# Patient Record
Sex: Female | Born: 1965 | Race: White | Hispanic: No | Smoking: Former smoker
Health system: Southern US, Community
[De-identification: ages and names within clinical notes are randomized; demographics above are authoritative.]

## PROBLEM LIST (undated history)

## (undated) DIAGNOSIS — F419 Anxiety disorder, unspecified: Secondary | ICD-10-CM

## (undated) DIAGNOSIS — N8501 Benign endometrial hyperplasia: Secondary | ICD-10-CM

## (undated) DIAGNOSIS — Z8719 Personal history of other diseases of the digestive system: Secondary | ICD-10-CM

## (undated) DIAGNOSIS — I10 Essential (primary) hypertension: Secondary | ICD-10-CM

## (undated) DIAGNOSIS — G5603 Carpal tunnel syndrome, bilateral upper limbs: Secondary | ICD-10-CM

## (undated) DIAGNOSIS — E785 Hyperlipidemia, unspecified: Secondary | ICD-10-CM

## (undated) DIAGNOSIS — F32A Depression, unspecified: Secondary | ICD-10-CM

## (undated) DIAGNOSIS — E669 Obesity, unspecified: Secondary | ICD-10-CM

## (undated) DIAGNOSIS — F329 Major depressive disorder, single episode, unspecified: Secondary | ICD-10-CM

## (undated) DIAGNOSIS — I2699 Other pulmonary embolism without acute cor pulmonale: Secondary | ICD-10-CM

## (undated) DIAGNOSIS — K219 Gastro-esophageal reflux disease without esophagitis: Secondary | ICD-10-CM

## (undated) DIAGNOSIS — J189 Pneumonia, unspecified organism: Secondary | ICD-10-CM

## (undated) DIAGNOSIS — N950 Postmenopausal bleeding: Secondary | ICD-10-CM

## (undated) HISTORY — DX: Essential (primary) hypertension: I10

## (undated) HISTORY — PX: WISDOM TOOTH EXTRACTION: SHX21

## (undated) HISTORY — PX: UPPER GI ENDOSCOPY: SHX6162

## (undated) HISTORY — PX: COLONOSCOPY: SHX174

---
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
## 2018-01-22 ENCOUNTER — Other Ambulatory Visit (HOSPITAL_COMMUNITY): Payer: Self-pay | Admitting: General Surgery

## 2018-01-23 ENCOUNTER — Other Ambulatory Visit: Payer: Self-pay | Admitting: General Surgery

## 2018-01-23 DIAGNOSIS — Z1231 Encounter for screening mammogram for malignant neoplasm of breast: Secondary | ICD-10-CM

## 2018-01-29 ENCOUNTER — Encounter (HOSPITAL_COMMUNITY): Payer: Self-pay | Admitting: Radiology

## 2018-01-29 ENCOUNTER — Ambulatory Visit (HOSPITAL_COMMUNITY)
Admission: RE | Admit: 2018-01-29 | Discharge: 2018-01-29 | Disposition: A | Discharge status: Home / Self Care | Payer: BC Managed Care – PPO | Source: Ambulatory Visit | Attending: General Surgery | Admitting: General Surgery

## 2018-01-29 ENCOUNTER — Other Ambulatory Visit (HOSPITAL_COMMUNITY): Payer: Self-pay | Admitting: General Surgery

## 2018-01-29 DIAGNOSIS — R918 Other nonspecific abnormal finding of lung field: Secondary | ICD-10-CM | POA: Insufficient documentation

## 2018-01-29 DIAGNOSIS — K449 Diaphragmatic hernia without obstruction or gangrene: Secondary | ICD-10-CM | POA: Insufficient documentation

## 2018-02-18 ENCOUNTER — Encounter: Payer: Self-pay | Admitting: Registered"

## 2018-02-18 ENCOUNTER — Encounter: Payer: BC Managed Care – PPO | Attending: General Surgery | Admitting: Registered"

## 2018-02-18 DIAGNOSIS — I10 Essential (primary) hypertension: Secondary | ICD-10-CM | POA: Diagnosis not present

## 2018-02-18 DIAGNOSIS — E669 Obesity, unspecified: Secondary | ICD-10-CM

## 2018-02-18 DIAGNOSIS — Z713 Dietary counseling and surveillance: Secondary | ICD-10-CM | POA: Insufficient documentation

## 2018-02-18 DIAGNOSIS — E785 Hyperlipidemia, unspecified: Secondary | ICD-10-CM | POA: Insufficient documentation

## 2018-02-18 DIAGNOSIS — Z6841 Body Mass Index (BMI) 40.0 and over, adult: Secondary | ICD-10-CM | POA: Diagnosis not present

## 2018-02-18 NOTE — Patient Instructions (Signed)
-   Replace pop tarts with protein shake for breakfast.

## 2018-02-18 NOTE — Progress Notes (Signed)
Pre-Op Assessment Visit:  Pre-Operative RYGB Surgery  Medical Nutrition Therapy:  Appt start time: 3:15  End time:  4:30  Patient was seen on 02/18/2018 for Pre-Operative Nutrition Assessment. Assessment and letter of approval [facility] to [facility] coordinator on 02/18/2018.   Pt expectation of surgery: improve health, feel better, improve hip pain, increase ability to walk for longer distances, improve quality of life   Pt expectation of Dietitian: educate and equip  Start weight at [facility]: 222.6 BMI: 42.76   Pt arrives with boyfriend. Pt states her sister had bariatric surgery with our program. Pt states she has done a lot of research and participates in various online bariatric support groups. Pt states she feels like this is her last option to prevent heart conditions that her mom experienced. Pt states life is stressful right now with work; works as Sports coachelementary school teacher assistant, bus driver, and at daycare in the evenings.   Per insurance, pt needs 2 SWL visits prior to surgery. Pt will need Vitamins and Mineral Recommendations education and handout at next visit.    24 hr Dietary Recall: First Meal: skips on weekends; 2 pop tarts (chocolate fudge) Snack: none Second Meal: skips on weekends; sandwich (ham, cheese, lettuce) + creamy tomato soup + wheat crackers Snack: none Third Meal: sausage + scrambled eggs + hashbrowns or soft tacos (ground beef + vegetables) or spaghetti/hamburger helper + garlic bread  Snack: none Beverages: diet cherry Dr. Reino KentPepper, orange juice, water, wine  Encouraged to engage in 75 minutes of moderate physical activity including cardiovascular and weight baring weekly  Handouts given during visit include:  . Pre-Op Goals . Bariatric Surgery Protein Shakes . Vitamin and Mineral Recommendations  During the appointment today the following Pre-Op Goals were reviewed with the patient: . Track your food and beverage:  MyFitness Pal or Baritastic App . Make healthy food choices . Begin to limit portion sizes . Limited concentrated sugars and fried foods . Keep fat/sugar in the single digits per serving on          food labels . Practice CHEWING your food  (aim for 30 chews per bite or until applesauce consistency) . Practice not drinking 15 minutes before, during, and 30 minutes after each meal/snack . Avoid all carbonated beverages  . Avoid/limit caffeinated beverages  . Avoid all sugar-sweetened beverages . Avoid alcohol . Consume 3 meals per day; eat every 3-5 hours . Make a list of non-food related activities . Aim for 64-100 ounces of FLUID daily  . Aim for at least 60-80 grams of PROTEIN daily . Look for a liquid protein source that contain ?15 g protein and ?5 g carbohydrate  (ex: shakes, drinks, shots) . Physical activity is an important part of a healthy lifestyle so keep it moving!  Follow diet recommendations listed below Energy and Macronutrient Recommendations: Calories: 1600 Carbohydrate: 180 Protein: 120 Fat: 44  Demonstrated degree of understanding via:  Teach Back   Teaching Method Utilized:  Visual Auditory Hands on  Barriers to learning/adherence to lifestyle change: work-life balance  Patient to call the [facility] to enroll in Pre-Op and Post-Op Nutrition Education when surgery date is scheduled.

## 2018-03-20 ENCOUNTER — Encounter: Payer: BC Managed Care – PPO | Attending: General Surgery | Admitting: Skilled Nursing Facility1

## 2018-03-20 ENCOUNTER — Encounter: Payer: Self-pay | Admitting: Skilled Nursing Facility1

## 2018-03-20 DIAGNOSIS — Z6841 Body Mass Index (BMI) 40.0 and over, adult: Secondary | ICD-10-CM | POA: Insufficient documentation

## 2018-03-20 DIAGNOSIS — I10 Essential (primary) hypertension: Secondary | ICD-10-CM | POA: Insufficient documentation

## 2018-03-20 DIAGNOSIS — Z713 Dietary counseling and surveillance: Secondary | ICD-10-CM | POA: Diagnosis present

## 2018-03-20 DIAGNOSIS — E669 Obesity, unspecified: Secondary | ICD-10-CM

## 2018-03-20 DIAGNOSIS — E785 Hyperlipidemia, unspecified: Secondary | ICD-10-CM | POA: Insufficient documentation

## 2018-03-20 NOTE — Progress Notes (Signed)
RYGB Assessment:   1 SWL Appointment.   Per insurance, pt needs 2 SWL visits prior to surgery.    Pt states she has been drinking 60 ounces of water a day. Pt states she cannot afford the protein shakes so she tries to get the protein powder. Pt states she is financially strained which is a concerned. Dietitian educated the pt on needing the funds available to be provided for after surgery. Pt states her and her daughter live together.   Start weight at [facility]: 222.6 Weight: 223 BMI: 42.83  MEDICATIONS: see list   DIETARY INTAKE:  24-hr recall: skipping breakfast and lunch on the weekend, not drinking water on the weekend B ( AM):  poptart with diet dr pepper Snk ( AM):  L ( PM): sandwich and creamy tomato soup and crackers  Snk ( PM):  D ( PM): egg sausage and hashbrown  Snk ( PM):  Beverages: water, diet soda  Usual physical activity: ADL's  Calories: 1600 Carbohydrate: 180 Protein: 120 Fat: 44    Nutritional Diagnosis:  [place]-3.3 Overweight/obesity related to past poor dietary habits and physical inactivity as evidenced by patient w/ planned RYGB surgery following dietary guidelines for continued weight loss.    Intervention:  Nutrition counseling for upcoming Bariatric Surgery. Goals: -Encouraged to engage in 150 minutes of moderate physical activity including cardiovascular and weight baring weekly -Chew until applesauce consistency  -Work on consistently eating 3-5 hours during the week and weekend in addition to getting in water -Work on eating non starchy veggies 2 times a day 7 days a week   Teaching Method Utilized:  Visual Auditory Hands on  Barriers to learning/adherence to lifestyle change: none identified   Demonstrated degree of understanding via:  Teach Back   Monitoring/Evaluation:  Dietary intake, exercise, and body weight prn.

## 2018-05-01 ENCOUNTER — Encounter: Payer: Self-pay | Admitting: Skilled Nursing Facility1

## 2018-05-01 ENCOUNTER — Encounter: Payer: BC Managed Care – PPO | Attending: General Surgery | Admitting: Skilled Nursing Facility1

## 2018-05-01 DIAGNOSIS — E785 Hyperlipidemia, unspecified: Secondary | ICD-10-CM | POA: Diagnosis not present

## 2018-05-01 DIAGNOSIS — E669 Obesity, unspecified: Secondary | ICD-10-CM

## 2018-05-01 DIAGNOSIS — I10 Essential (primary) hypertension: Secondary | ICD-10-CM | POA: Insufficient documentation

## 2018-05-01 DIAGNOSIS — Z713 Dietary counseling and surveillance: Secondary | ICD-10-CM | POA: Insufficient documentation

## 2018-05-01 DIAGNOSIS — Z6841 Body Mass Index (BMI) 40.0 and over, adult: Secondary | ICD-10-CM | POA: Insufficient documentation

## 2018-05-01 NOTE — Progress Notes (Signed)
RYGB Assessment:  2nd SWL Appointment.   Per insurance, pt needs 2 SWL visits prior to surgery.    Pt states she has been drinking 60 ounces of water a day.  Pt arrives having gained about 2 pounds. Pt states she works at an [employer]. Pt complained of not being able to get in contact wth [facility] in a timely manner.  Pt states she feels she has been most successful with eating non starchy vegetables, chewing until applesauce consistency and drinking more water Pt states she thinks she may struggle with meeting her needs because her stomach is going to be smaller and not drinking diet dr pepper  Start weight at [facility]: 222.6 Weight: 225 BMI: 43.26  MEDICATIONS: see list   DIETARY INTAKE:  24-hr recall: skipping breakfast and lunch on the weekend, not drinking water on the weekend B ( AM):  poptart with diet dr pepper Snk ( AM):  L ( PM): sandwich and creamy tomato soup and crackers and carrots or celery Snk ( PM):  D ( PM): egg sausage and hashbrown or broccoli with cheese and hamburger steak or marinated chicken with salad  Snk ( PM): cakes before bed  Beverages: water, diet soda  Usual physical activity: ADL's  Calories: 1600 Carbohydrate: 180 Protein: 120 Fat: 44    Nutritional Diagnosis:  [place]-3.3 Overweight/obesity related to past poor dietary habits and physical inactivity as evidenced by patient w/ planned RYGB surgery following dietary guidelines for continued weight loss.    Intervention:  Nutrition counseling for upcoming Bariatric Surgery. Goals: -Encouraged to engage in 150 minutes of moderate physical activity including cardiovascular and weight baring weekly -Chew until applesauce consistency  -Work on consistently eating 3-5 hours during the week and weekend in addition to getting in water -Work on eating non starchy veggies 2 times a day 7 days a week   Teaching Method Utilized:  Visual Auditory Hands on  Barriers to learning/adherence to lifestyle  change: none identified   Demonstrated degree of understanding via:  Teach Back   Monitoring/Evaluation:  Dietary intake, exercise, and body weight prn.

## 2018-06-03 ENCOUNTER — Ambulatory Visit: Payer: BC Managed Care – PPO

## 2018-09-27 HISTORY — PX: DILATION AND CURETTAGE OF UTERUS: SHX78

## 2018-10-07 ENCOUNTER — Ambulatory Visit: Payer: BC Managed Care – PPO

## 2018-11-07 HISTORY — PX: TOTAL LAPAROSCOPIC HYSTERECTOMY WITH BILATERAL SALPINGO OOPHORECTOMY: SHX6845

## 2018-12-27 ENCOUNTER — Ambulatory Visit: Payer: Self-pay | Admitting: Surgery

## 2018-12-27 NOTE — H&P (Signed)
Surgical H&P  CC: morbid obesity  HPI: Follows up for management of severe obesity. Labs completed last year noted only mildly low B12 and vit D which were addressed by PCP. Last visit with dieticians was 05/01/18. Did not have psych eval until 10/13/18 [facility] [place], [place]).  Chest x-ray completed September 2017 with bilateral interstitial prominence which may reflect smoking history, chronic bronchitis or reactive airway disease, no pneumonia or pulmonary edema.  Her upper GI did demonstrate a small intermittent type I hiatal hernia but otherwise was normal.  She underwent a laparoscopic hysterectomy and bilateral salpingo-oophorectomy 11/07/18 for postmenopausal bleeding.  She recovered very quickly with minimal pain.  She states that her gynecologist has cleared her to proceed with any further bariatric surgery.  Her preoperative classes scheduled for August 3.  She is tentatively scheduled for Roux-en-Y gastric bypass on August 31.  She reports she quit smoking in March.  She feels confident that she'll never want to smoke again.  She is a Engineer, site.  Initial visit 11/28/17, Dr Excell Seltzer: "The patient is a 53 year old female who presents with obesity. Patient presents for consideration for surgical treatment for morbid obesity.  She is the twin sister of a previous gastric bypass patient of mine who has done very well.  The patient gives a history of progressive obesity since early adulthood despite multiple attempts at medical management.  She has been labile to lose up to 50 pounds at a time but then experiences progressive weight regain Obesity has been affecting the patient in a number of ways including difficulties with routine activities and fatigue as well as increasing joint pain..  Significant co-morbid illnesses have developed including hypertension, elevated cholesterol, and reflux requiring daily medications.  The patient has been to our initial information  seminar, researched surgical options thoroughly, and is interested in gastric bypass due to her severe reflux.  She is a current cigarette smoker.  She has a remote history of pulmonary embolus about 11 years ago after hospitalization for pneumonia and was on birth control pills.   Wt 218lb, BMI 41.19.   OBESITY, MORBID, BMI 40.0-49.9 (E66.01) Impression: Patient with progressive morbid obesity unresponsive to multiple efforts at medical management who presents with a BMI of 41 and comorbidities of hypertension, elevated cholesterol, GERD and chronic joint pain. I believe there would be very significant medical benefit from surgical weight loss. After our discussion of surgical options currently available the patient has decided to proceed with laparoscopic Roux-en-Y gastric bypass due to the reasons above. We have discussed the nature of the problem and the risks of remaining morbidly obese. We discussed laparoscopic Roux-en-Y gastric bypass in detail including the nature of the procedure, expected hospitalization and recovery, and major risks of anesthetic complications, bleeding, blood clots and pulmonary emboli, leakage and infection and long-term risks of stricture, ulceration, bowel obstruction, nutritional deficiencies, and failure to lose weight or weight regain. We discussed these problems could lead to death. The patient was given a complete consent form to review and all questions were answered. We will go ahead with preoperative evaluation including psychological and nutrition evaluations, H. pylori testing, ultrasound, bone density, and routine lab and x-rays. I will see the patient back following these studies. We discussed that she will have to quit smoking and we will check a nicotine level preoperatively. We also discussed her previous history of pulmonary embolus which was remote and she was on birth control pills. We would likely consider more prolonged postoperative  prophylaxis.   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 Amoxicillin Diarrhea    Past Medical History:  Diagnosis Date  . Hypertension     No past surgical history on file.  Family History  Problem Relation Age of Onset  . Cancer Other   . Hypertension Other   . Heart disease Other     Social History   Socioeconomic History  . Marital status: Divorced    Spouse name: Not on file  . Number of children: Not on file  . Years of education: Not on file  . Highest education level: Not on file  Occupational History  . Not on file  Social Needs  . Financial resource strain: Not on file  . Food insecurity    Worry: Not on file    Inability: Not on file  . Transportation needs    Medical: Not on file    Non-medical: Not on file  Tobacco Use  . Smoking status: Not on file  Substance and Sexual Activity  . Alcohol use: Not on file  . Drug use: Not on file  . Sexual activity: Not on file  Lifestyle  . Physical activity    Days per week: Not on file    Minutes per session: Not on file  . Stress: Not on file  Relationships  . Social Musicianconnections    Talks on phone: Not on file    Gets together: Not on file    Attends religious service: Not on file    Active member of club or organization: Not on file    Attends meetings of clubs or organizations: Not on file    Relationship status: Not on file  Other Topics Concern  . Not on file  Social History Narrative  . Not on file    Current Outpatient Medications on File Prior to Visit  Medication Sig Dispense Refill  . amLODipine (NORVASC) 10 MG tablet Take 10 mg by mouth daily.    . Cholecalciferol (VITAMIN D) 2000 units CAPS Take by mouth.    . cyanocobalamin 1000 MCG tablet Take 1,000 mcg by mouth daily.    Marland Kitchen. escitalopram (LEXAPRO) 20 MG tablet Take 20 mg by mouth daily.    Marland Kitchen. lisinopril  (PRINIVIL,ZESTRIL) 20 MG tablet Take 20 mg by mouth daily.    Marland Kitchen. omeprazole (PRILOSEC) 20 MG capsule Take 20 mg by mouth daily.    . simvastatin (ZOCOR) 40 MG tablet Take 40 mg by mouth daily.     No current facility-administered medications on file prior to visit.     Review of Systems: a complete, 10pt review of systems was completed with pertinent positives and negatives as documented in the HPI  Physical Exam: Vitals (Sabrina Canty CMA; 12/27/2018 2:21 PM) 12/27/2018 2:20 PM Weight: 239.13 lb Height: 61in Body Surface Area: 2.04 m Body Mass Index: 45.18 kg/m  Temp.: 97.49F(Temporal)  Pulse: 100 (Regular)  P.OX: 96% (Room air) BP: 152/90 (Sitting, Left Arm, Standard)  Gen: alert and well appearing Eye: extraocular motion intact, no scleral icterus ENT: moist mucus membranes, dentition intact Neck: no mass or thyromegaly Chest: unlabored respirations, symmetrical air entry, clear bilaterally CV: regular rate and rhythm, no pedal edema Abdomen: soft, nontender, nondistended. No mass or organomegaly. Well-healed laparoscopic port sites 5 across mid abdomen. MSK: strength symmetrical throughout, no deformity Neuro: grossly intact, normal gait Psych: normal mood and affect, appropriate insight  Skin: warm and dry, no rash or lesion on limited exam    No flowsheet data found.  No flowsheet data found.  No results found for: INR, PROTIME  Imaging: No results found.   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 We once again discussed the surgery including technique, risks, and typical perioperative course. She is ready to proceed. We'll plan to keep as scheduled at the end of August, would not do anything sooner than this.     Phylliss Blakeshelsea Shalissa Easterwood, MD [facility] [place] Pager [phone]

## 2018-12-30 ENCOUNTER — Encounter: Payer: BC Managed Care – PPO | Attending: General Surgery | Admitting: Skilled Nursing Facility1

## 2018-12-30 ENCOUNTER — Other Ambulatory Visit: Payer: Self-pay

## 2018-12-30 DIAGNOSIS — E669 Obesity, unspecified: Secondary | ICD-10-CM | POA: Diagnosis present

## 2018-12-31 NOTE — Progress Notes (Signed)
Pre-Operative Nutrition Class:  Appt start time: 4739   End time:  1830.  Patient was seen on 12/30/2018 for Pre-Operative Bariatric Surgery Education at [facility].   Surgery date:  Surgery type: RYGB Start weight at [facility]: 222.2 Weight today: 241.2   The following the learning objectives were met by the patient during this course:  Identify Pre-Op Dietary Goals and will begin 2 weeks pre-operatively  Identify appropriate sources of fluids and proteins   State protein recommendations and appropriate sources pre and post-operatively  Identify Post-Operative Dietary Goals and will follow for 2 weeks post-operatively  Identify appropriate multivitamin and calcium sources  Describe the need for physical activity post-operatively and will follow MD recommendations  State when to call healthcare provider regarding medication questions or post-operative complications  Handouts given during class include:  Pre-Op Bariatric Surgery Diet Handout  Protein Shake Handout  Post-Op Bariatric Surgery Nutrition Handout  [facility] Program Information Flyer  Support Group Information Flyer  [facility] Bariatric Supplements Price List  Follow-Up Plan: Patient will follow-up at [facility] 2 weeks post operatively for diet advancement per MD.

## 2019-01-22 ENCOUNTER — Encounter (HOSPITAL_COMMUNITY): Payer: Self-pay

## 2019-01-22 NOTE — Patient Instructions (Signed)
[name] THE ONE VISITOR MAY VISIT WITH YOU IN YOUR PRIVATE ROOM DURING VISITING HOURS ONLY!!   COVID SWAB TESTING MUST BE COMPLETED ON: Today immediately after pre op appointment.  [name], [name], [name], [name], [name], [name], soups, [name], [name], honey syrup  Sample Menu Breakfast                                Lunch                                     Supper Cranberry juice                    Beef broth                            Chicken broth Jell-O  Grape juice                           Apple juice Coffee or tea                        Jell-O                                      Popsicle                                                Coffee or tea                         Coffee or tea   DRINK 1 G2 drink BEFORE YOU LEAVE HOME.   Brush your teeth the morning of surgery.   Do NOT smoke after Midnight   Take these medicines the morning of surgery with A SIP OF WATER: NONE                               You may not have any metal on your body including hair pins, jewelry, [name], lotions, powders, perfumes/cologne, [name], [name], [name], [name], [name], [name], [name], ears mouth and   genitals (private parts).                       Wash face,  Genitals (private parts) with your normal soap.             6.  Wash thoroughly, paying special attention to the area where your    surgery  will be performed.  7.  Thoroughly rinse your body with warm water from the neck down.  8.  DO NOT shower/wash with your normal soap after using and rinsing off the CHG Soap.                9.  Pat yourself dry with a clean towel.            10.  Wear clean pajamas.            11.  Place clean sheets on your bed the night of your first shower and do not  sleep with pets. Day of Surgery : Do not apply any lotions/deodorants the morning of surgery.  Please wear clean clothes to the hospital/surgery center.  FAILURE TO FOLLOW THESE INSTRUCTIONS MAY RESULT IN THE CANCELLATION OF YOUR SURGERY  PATIENT  SIGNATURE_________________________________  NURSE SIGNATURE__________________________________  ________________________________________________________________________   Jenny Ortiz  An incentive spirometer is a tool that can help keep your lungs clear and active. This tool measures how well you are filling your lungs with each breath. Taking long deep breaths may help reverse or decrease the chance of developing breathing (pulmonary) problems (especially infection) following:  A long period of time when you are unable to move or be active. BEFORE THE PROCEDURE   If the spirometer includes an indicator to show your best effort, your nurse or respiratory therapist will set it to a desired goal.  If possible, sit up straight or lean slightly forward. Try not to slouch.  Hold the incentive spirometer in an upright position. INSTRUCTIONS FOR USE  1. Sit on the edge of your bed if possible, or sit up as far as you can in bed or on a chair. 2. Hold the incentive spirometer in an upright position. 3. Breathe out normally. 4. Place the mouthpiece in your mouth and seal your lips tightly around it. 5. Breathe in slowly and as deeply as possible, raising the piston or the ball toward the top of the column. 6. Hold your breath for 3-5 seconds or for as long as possible. Allow the piston or ball to fall to the bottom of the column. 7. Remove the mouthpiece from your mouth and breathe out normally. 8. Rest for a few seconds and repeat Steps 1 through 7 at least 10 times every 1-2 hours when you are awake. Take your time and take a few normal breaths between deep breaths. 9. The spirometer may include an indicator to show your best effort. Use the indicator as a goal to work toward during each repetition. 10. After each set of 10 deep breaths, practice coughing to be sure your lungs are clear. If you have an incision (the cut made at the time of surgery), support your incision when coughing  by placing a pillow or rolled up towels firmly against it. Once you are able to get out of bed, walk around indoors and cough well. You may stop using the incentive spirometer when instructed by your caregiver.  RISKS AND COMPLICATIONS  Take your time so you do not get dizzy or light-headed.  If you are in pain, you may need to take or ask for pain medication before doing incentive spirometry. It is harder to take a deep breath if you are having pain. AFTER USE  Rest and breathe slowly and easily.  It can be helpful to keep track of a log of your progress. Your caregiver can provide you with a simple table to help with this. If you are using the spirometer at home, follow these instructions: SEEK MEDICAL CARE IF:   You are having difficultly using the spirometer.  You have trouble using the spirometer as often as instructed.  Your pain medication is not giving enough relief while using the spirometer.  You develop fever of 100.5 F (38.1 C) or higher. SEEK IMMEDIATE MEDICAL CARE IF:   You cough up bloody sputum that had not been present before.  You develop fever of 102 F (38.9 C) or greater.  You develop worsening pain at or near the incision site. MAKE SURE YOU:   Understand these instructions.  Will watch your condition.  Will get help right away if you are not doing well or get worse. Document Released: 09/25/2006 Document Revised: 08/07/2011 Document Reviewed: 11/26/2006 ExitCare Patient Information 2014 ExitCare, [place].   ________________________________________________________________________  WHAT IS A BLOOD TRANSFUSION? Blood Transfusion Information  A transfusion is the replacement of blood or some of its parts. Blood is made up of multiple cells which provide different functions.  Red blood cells carry oxygen and are used for blood loss replacement.  White blood cells fight against infection.  Platelets control bleeding.  Plasma helps clot  blood.  Other blood products are available for specialized needs, such as hemophilia or other clotting disorders. BEFORE THE TRANSFUSION  Who gives blood for transfusions?   Healthy volunteers who are fully evaluated to make sure their blood is safe. This is blood bank blood. Transfusion therapy is the safest it has ever been in the practice of medicine. Before blood is taken from a  donor, a complete history is taken to make sure that person has no history of diseases nor engages in risky social behavior (examples are intravenous drug use or sexual activity with multiple partners). The donor's travel history is screened to minimize risk of transmitting infections, such as malaria. The donated blood is tested for signs of infectious diseases, such as HIV and hepatitis. The blood is then tested to be sure it is compatible with you in order to minimize the chance of a transfusion reaction. If you or a relative donates blood, this is often done in anticipation of surgery and is not appropriate for emergency situations. It takes many days to process the donated blood. RISKS AND COMPLICATIONS Although transfusion therapy is very safe and saves many lives, the main dangers of transfusion include:   Getting an infectious disease.  Developing a transfusion reaction. This is an allergic reaction to something in the blood you were given. Every precaution is taken to prevent this. The decision to have a blood transfusion has been considered carefully by your caregiver before blood is given. Blood is not given unless the benefits outweigh the risks. AFTER THE TRANSFUSION  Right after receiving a blood transfusion, you will usually feel much better and more energetic. This is especially true if your red blood cells have gotten low (anemic). The transfusion raises the level of the red blood cells which carry oxygen, and this usually causes an energy increase.  The nurse administering the transfusion will monitor  you carefully for complications. HOME CARE INSTRUCTIONS  No special instructions are needed after a transfusion. You may find your energy is better. Speak with your caregiver about any limitations on activity for underlying diseases you may have. SEEK MEDICAL CARE IF:   Your condition is not improving after your transfusion.  You develop redness or irritation at the intravenous (IV) site. SEEK IMMEDIATE MEDICAL CARE IF:  Any of the following symptoms occur over the next 12 hours:  Shaking chills.  You have a temperature by mouth above 102 F (38.9 C), not controlled by medicine.  Chest, back, or muscle pain.  People around you feel you are not acting correctly or are confused.  Shortness of breath or difficulty breathing.  Dizziness and fainting.  You get a rash or develop hives.  You have a decrease in urine output.  Your urine turns a dark color or changes to pink, red, or brown. Any of the following symptoms occur over the next 10 days:  You have a temperature by mouth above 102 F (38.9 C), not controlled by medicine.  Shortness of breath.  Weakness after normal activity.  The white part of the eye turns yellow (jaundice).  You have a decrease in the amount of urine or are urinating less often.  Your urine turns a dark color or changes to pink, red, or brown. Document Released: 05/12/2000 Document Revised: 08/07/2011 Document Reviewed: 12/30/2007 [facility] Patient Information 2014 [place].  _______________________________________________________________________

## 2019-01-23 ENCOUNTER — Encounter (HOSPITAL_COMMUNITY)
Admission: RE | Admit: 2019-01-23 | Discharge: 2019-01-23 | Disposition: A | Discharge status: Home / Self Care | Payer: BC Managed Care – PPO | Source: Ambulatory Visit | Attending: Surgery | Admitting: Surgery

## 2019-01-23 ENCOUNTER — Other Ambulatory Visit: Payer: Self-pay

## 2019-01-23 ENCOUNTER — Inpatient Hospital Stay (HOSPITAL_COMMUNITY)
Admission: RE | Admit: 2019-01-23 | Discharge: 2019-01-23 | Disposition: A | Discharge status: Home / Self Care | Payer: BC Managed Care – PPO | Source: Ambulatory Visit

## 2019-01-23 ENCOUNTER — Encounter (HOSPITAL_COMMUNITY): Payer: Self-pay

## 2019-01-23 DIAGNOSIS — Z01812 Encounter for preprocedural laboratory examination: Secondary | ICD-10-CM | POA: Diagnosis present

## 2019-01-23 HISTORY — DX: Benign endometrial hyperplasia: N85.01

## 2019-01-23 HISTORY — DX: Pneumonia, unspecified organism: J18.9

## 2019-01-23 HISTORY — DX: Obesity, unspecified: E66.9

## 2019-01-23 HISTORY — DX: Gastro-esophageal reflux disease without esophagitis: K21.9

## 2019-01-23 HISTORY — DX: Personal history of other diseases of the digestive system: Z87.19

## 2019-01-23 HISTORY — DX: Other pulmonary embolism without acute cor pulmonale: I26.99

## 2019-01-23 HISTORY — DX: Depression, unspecified: F32.A

## 2019-01-23 HISTORY — DX: Hyperlipidemia, unspecified: E78.5

## 2019-01-23 HISTORY — DX: Carpal tunnel syndrome, bilateral upper limbs: G56.03

## 2019-01-23 HISTORY — DX: Postmenopausal bleeding: N95.0

## 2019-01-23 HISTORY — DX: Anxiety disorder, unspecified: F41.9

## 2019-01-23 LAB — COMPREHENSIVE METABOLIC PANEL
ALT: 73 U/L — ABNORMAL HIGH (ref 0–44)
AST: 51 U/L — ABNORMAL HIGH (ref 15–41)
Albumin: 4.4 g/dL (ref 3.5–5.0)
Alkaline Phosphatase: 93 U/L (ref 38–126)
Anion gap: 9 (ref 5–15)
BUN: 23 mg/dL — ABNORMAL HIGH (ref 6–20)
CO2: 24 mmol/L (ref 22–32)
Calcium: 9.2 mg/dL (ref 8.9–10.3)
Chloride: 106 mmol/L (ref 98–111)
Creatinine, Ser: 0.83 mg/dL (ref 0.44–1.00)
GFR calc Af Amer: >60 mL/min (ref >60)
GFR calc non Af Amer: >60 mL/min (ref >60)
Glucose, Bld: 95 mg/dL (ref 70–99)
Potassium: 3.9 mmol/L (ref 3.5–5.1)
Sodium: 139 mmol/L (ref 135–145)
Total Bilirubin: 1.1 mg/dL (ref 0.3–1.2)
Total Protein: 7.6 g/dL (ref 6.5–8.1)

## 2019-01-23 LAB — CBC WITH DIFFERENTIAL/PLATELET
Abs Immature Granulocytes: 0.02 10*3/uL (ref 0.00–0.07)
Basophils Absolute: 0 10*3/uL (ref 0.0–0.1)
Basophils Relative: 1 %
Eosinophils Absolute: 0.1 10*3/uL (ref 0.0–0.5)
Eosinophils Relative: 1 %
HCT: 39.7 % (ref 36.0–46.0)
Hemoglobin: 13.6 g/dL (ref 12.0–15.0)
Immature Granulocytes: 0 %
Lymphocytes Relative: 35 %
Lymphs Abs: 2.5 10*3/uL (ref 0.7–4.0)
MCH: 33.5 pg (ref 26.0–34.0)
MCHC: 34.3 g/dL (ref 30.0–36.0)
MCV: 97.8 fL (ref 80.0–100.0)
Monocytes Absolute: 0.8 10*3/uL (ref 0.1–1.0)
Monocytes Relative: 11 %
Neutro Abs: 3.7 10*3/uL (ref 1.7–7.7)
Neutrophils Relative %: 52 %
Platelets: 246 10*3/uL (ref 150–400)
RBC: 4.06 MIL/uL (ref 3.87–5.11)
RDW: 12.4 % (ref 11.5–15.5)
WBC: 7.1 10*3/uL (ref 4.0–10.5)
nRBC: 0 % (ref 0.0–0.2)

## 2019-01-23 LAB — ABO/RH: ABO/RH(D): A POS

## 2019-01-23 NOTE — Progress Notes (Signed)
SPOKE W/  Jenny Ortiz     SCREENING SYMPTOMS OF COVID 19:   COUGH--NO  RUNNY NOSE--- NO  SORE THROAT---NO  NASAL CONGESTION----NO  SNEEZING----NO  SHORTNESS OF BREATH---NO  DIFFICULTY BREATHING---NO  TEMP >100.0 -----NO  UNEXPLAINED BODY ACHES------NO  CHILLS -------- NO  HEADACHES ---------NO  LOSS OF SMELL/ TASTE --------NO    HAVE YOU OR ANY FAMILY MEMBER TRAVELLED PAST 14 DAYS OUT OF THE   COUNTY---Lives in [facility] STATE----NO COUNTRY----NO  HAVE YOU OR ANY FAMILY MEMBER BEEN EXPOSED TO ANYONE WITH COVID 19? NO

## 2019-01-23 NOTE — Progress Notes (Signed)
LVM for pt to come tomorrow for COVID testing, as pt missed her appt today.

## 2019-01-23 NOTE — Progress Notes (Addendum)
PCP - Dr. Barbaraann Faster [facility] Internal Medicine, [facility] Cardiologist - N/A  Chest x-ray - 02/08/2018 in epic EKG - 09/18/2018 requested from [facility], placed in chart Stress Test - N/A ECHO - N/A Cardiac Cath - N/A  Sleep Study - N/A CPAP - N/A  Fasting Blood Sugar - N/A Checks Blood Sugar __ times a day  Blood Thinner Instructions:N/A Aspirin Instructions: Last Dose:  Anesthesia review: History of PE after pneumonia, treated with Warfarin for 18 months afterward, no longer on Warfarin  Patient denies shortness of breath, fever, cough and chest pain at PAT appointment   Patient verbalized understanding of instructions that were given to them at the PAT appointment. Patient was also instructed that they will need to review over the PAT instructions again at home before surgery.

## 2019-01-24 ENCOUNTER — Other Ambulatory Visit (HOSPITAL_COMMUNITY)
Admission: RE | Admit: 2019-01-24 | Discharge: 2019-01-24 | Disposition: A | Discharge status: Home / Self Care | Payer: BC Managed Care – PPO | Source: Ambulatory Visit | Attending: Surgery | Admitting: Surgery

## 2019-01-24 DIAGNOSIS — Z01812 Encounter for preprocedural laboratory examination: Secondary | ICD-10-CM | POA: Insufficient documentation

## 2019-01-24 DIAGNOSIS — Z20828 Contact with and (suspected) exposure to other viral communicable diseases: Secondary | ICD-10-CM | POA: Diagnosis not present

## 2019-01-24 LAB — SARS CORONAVIRUS 2 (TAT 6-24 HRS): SARS Coronavirus 2: NEGATIVE

## 2019-01-26 MED ORDER — BUPIVACAINE LIPOSOME 1.3 % IJ SUSP
20.0000 mL | Freq: Once | INTRAMUSCULAR | Status: DC
  Filled 2019-01-26: qty 20

## 2019-01-27 ENCOUNTER — Encounter (HOSPITAL_COMMUNITY): Payer: Self-pay | Admitting: *Deleted

## 2019-01-27 ENCOUNTER — Inpatient Hospital Stay (HOSPITAL_COMMUNITY)
Admission: RE | Admit: 2019-01-27 | Discharge: 2019-01-30 | DRG: 621 | Disposition: A | Discharge status: Home / Self Care | Payer: BC Managed Care – PPO | Attending: Surgery | Admitting: Surgery

## 2019-01-27 ENCOUNTER — Inpatient Hospital Stay (HOSPITAL_COMMUNITY): Payer: BC Managed Care – PPO | Admitting: Certified Registered Nurse Anesthetist

## 2019-01-27 ENCOUNTER — Other Ambulatory Visit: Payer: Self-pay

## 2019-01-27 ENCOUNTER — Inpatient Hospital Stay (HOSPITAL_COMMUNITY): Payer: BC Managed Care – PPO | Admitting: Physician Assistant

## 2019-01-27 ENCOUNTER — Encounter (HOSPITAL_COMMUNITY)
Admission: RE | Disposition: A | Discharge status: Home / Self Care | Payer: Self-pay | Source: Home / Self Care | Attending: Surgery

## 2019-01-27 DIAGNOSIS — Z86711 Personal history of pulmonary embolism: Secondary | ICD-10-CM | POA: Diagnosis not present

## 2019-01-27 DIAGNOSIS — E785 Hyperlipidemia, unspecified: Secondary | ICD-10-CM | POA: Diagnosis present

## 2019-01-27 DIAGNOSIS — Z6841 Body Mass Index (BMI) 40.0 and over, adult: Secondary | ICD-10-CM | POA: Diagnosis not present

## 2019-01-27 DIAGNOSIS — K219 Gastro-esophageal reflux disease without esophagitis: Secondary | ICD-10-CM | POA: Diagnosis present

## 2019-01-27 DIAGNOSIS — K449 Diaphragmatic hernia without obstruction or gangrene: Secondary | ICD-10-CM | POA: Diagnosis present

## 2019-01-27 DIAGNOSIS — I10 Essential (primary) hypertension: Secondary | ICD-10-CM | POA: Diagnosis present

## 2019-01-27 DIAGNOSIS — Z79899 Other long term (current) drug therapy: Secondary | ICD-10-CM

## 2019-01-27 DIAGNOSIS — G8929 Other chronic pain: Secondary | ICD-10-CM | POA: Diagnosis present

## 2019-01-27 DIAGNOSIS — Z90710 Acquired absence of both cervix and uterus: Secondary | ICD-10-CM

## 2019-01-27 DIAGNOSIS — F17210 Nicotine dependence, cigarettes, uncomplicated: Secondary | ICD-10-CM | POA: Diagnosis present

## 2019-01-27 HISTORY — PX: LAPAROSCOPIC ROUX-EN-Y GASTRIC BYPASS WITH HIATAL HERNIA REPAIR: SHX6513

## 2019-01-27 LAB — TYPE AND SCREEN
ABO/RH(D): A POS
Antibody Screen: NEGATIVE

## 2019-01-27 SURGERY — CREATION, GASTRIC BYPASS, LAPAROSCOPIC, USING ROUX-EN-Y GASTROENTEROSTOMY, WITH HIATAL HERNIA REPAIR
Anesthesia: General | Site: Abdomen

## 2019-01-27 MED ORDER — ONDANSETRON HCL 4 MG/2ML IJ SOLN
4.0000 mg | INTRAMUSCULAR | Status: DC | PRN
  Administered 2019-01-28: 4 mg via INTRAVENOUS
  Filled 2019-01-27: qty 2

## 2019-01-27 MED ORDER — GABAPENTIN 300 MG PO CAPS
300.0000 mg | ORAL_CAPSULE | ORAL | Status: AC
  Administered 2019-01-27: 300 mg via ORAL
  Filled 2019-01-27: qty 1

## 2019-01-27 MED ORDER — LIDOCAINE 2% (20 MG/ML) 5 ML SYRINGE
INTRAMUSCULAR | Status: DC | PRN
  Administered 2019-01-27: 1.5 mg/kg/h via INTRAVENOUS

## 2019-01-27 MED ORDER — FENTANYL CITRATE (PF) 100 MCG/2ML IJ SOLN
25.0000 ug | INTRAMUSCULAR | Status: DC | PRN
  Administered 2019-01-27 (×3): 50 ug via INTRAVENOUS

## 2019-01-27 MED ORDER — SUGAMMADEX SODIUM 500 MG/5ML IV SOLN
INTRAVENOUS | Status: AC
  Filled 2019-01-27: qty 5

## 2019-01-27 MED ORDER — ACETAMINOPHEN 160 MG/5ML PO SOLN
1000.0000 mg | Freq: Three times a day (TID) | ORAL | Status: DC
  Administered 2019-01-27 – 2019-01-29 (×3): 1000 mg via ORAL
  Filled 2019-01-27 (×3): qty 40.6

## 2019-01-27 MED ORDER — SODIUM CHLORIDE 0.9 % IV SOLN
2.0000 g | INTRAVENOUS | Status: AC
  Administered 2019-01-27: 07:00:00 2 g via INTRAVENOUS
  Filled 2019-01-27: qty 2

## 2019-01-27 MED ORDER — SUGAMMADEX SODIUM 200 MG/2ML IV SOLN
INTRAVENOUS | Status: DC | PRN
  Administered 2019-01-27: 250 mg via INTRAVENOUS

## 2019-01-27 MED ORDER — SIMETHICONE 80 MG PO CHEW: 80.0000 mg | CHEWABLE_TABLET | Freq: Four times a day (QID) | ORAL | Status: DC | PRN

## 2019-01-27 MED ORDER — ESCITALOPRAM OXALATE 20 MG PO TABS
20.0000 mg | ORAL_TABLET | Freq: Every day | ORAL | Status: DC
  Administered 2019-01-27 – 2019-01-29 (×3): 20 mg via ORAL
  Filled 2019-01-27 (×3): qty 1

## 2019-01-27 MED ORDER — OXYCODONE HCL 5 MG/5ML PO SOLN: 5.0000 mg | Freq: Once | ORAL | Status: DC | PRN

## 2019-01-27 MED ORDER — METHOCARBAMOL 500 MG IVPB - SIMPLE MED
500.0000 mg | Freq: Four times a day (QID) | INTRAVENOUS | Status: DC | PRN
  Administered 2019-01-27 – 2019-01-29 (×3): 500 mg via INTRAVENOUS
  Filled 2019-01-27 (×2): qty 500
  Filled 2019-01-27: qty 50

## 2019-01-27 MED ORDER — ROCURONIUM BROMIDE 10 MG/ML (PF) SYRINGE
PREFILLED_SYRINGE | INTRAVENOUS | Status: AC
  Filled 2019-01-27: qty 10

## 2019-01-27 MED ORDER — 0.9 % SODIUM CHLORIDE (POUR BTL) OPTIME
TOPICAL | Status: DC | PRN
  Administered 2019-01-27: 08:00:00 1000 mL

## 2019-01-27 MED ORDER — FENTANYL CITRATE (PF) 250 MCG/5ML IJ SOLN
INTRAMUSCULAR | Status: DC | PRN
  Administered 2019-01-27 (×5): 50 ug via INTRAVENOUS

## 2019-01-27 MED ORDER — OXYCODONE HCL 5 MG PO TABS: 5.0000 mg | ORAL_TABLET | Freq: Once | ORAL | Status: DC | PRN

## 2019-01-27 MED ORDER — ENOXAPARIN SODIUM 40 MG/0.4ML ~~LOC~~ SOLN
40.0000 mg | SUBCUTANEOUS | Status: AC
  Administered 2019-01-27: 40 mg via SUBCUTANEOUS
  Filled 2019-01-27: qty 0.4

## 2019-01-27 MED ORDER — LIDOCAINE 2% (20 MG/ML) 5 ML SYRINGE
INTRAMUSCULAR | Status: AC
  Filled 2019-01-27: qty 5

## 2019-01-27 MED ORDER — STERILE WATER FOR IRRIGATION IR SOLN
Status: DC | PRN
  Administered 2019-01-27: 2000 mL

## 2019-01-27 MED ORDER — CHLORHEXIDINE GLUCONATE 4 % EX LIQD: 60.0000 mL | Freq: Once | CUTANEOUS | Status: DC

## 2019-01-27 MED ORDER — SODIUM CHLORIDE 0.9 % IV SOLN: INTRAVENOUS | Status: DC

## 2019-01-27 MED ORDER — DEXAMETHASONE SODIUM PHOSPHATE 10 MG/ML IJ SOLN
INTRAMUSCULAR | Status: DC | PRN
  Administered 2019-01-27: 5 mg via INTRAVENOUS

## 2019-01-27 MED ORDER — SUCCINYLCHOLINE CHLORIDE 200 MG/10ML IV SOSY
PREFILLED_SYRINGE | INTRAVENOUS | Status: DC | PRN
  Administered 2019-01-27: 100 mg via INTRAVENOUS

## 2019-01-27 MED ORDER — BUPIVACAINE LIPOSOME 1.3 % IJ SUSP
INTRAMUSCULAR | Status: DC | PRN
  Administered 2019-01-27: 20 mL

## 2019-01-27 MED ORDER — PHENYLEPHRINE 40 MCG/ML (10ML) SYRINGE FOR IV PUSH (FOR BLOOD PRESSURE SUPPORT)
PREFILLED_SYRINGE | INTRAVENOUS | Status: DC | PRN
  Administered 2019-01-27: 40 ug via INTRAVENOUS

## 2019-01-27 MED ORDER — DEXAMETHASONE SODIUM PHOSPHATE 10 MG/ML IJ SOLN
INTRAMUSCULAR | Status: AC
  Filled 2019-01-27: qty 1

## 2019-01-27 MED ORDER — LACTATED RINGERS IR SOLN
Status: DC | PRN
  Administered 2019-01-27: 1000 mL

## 2019-01-27 MED ORDER — TRAMADOL HCL 50 MG PO TABS
50.0000 mg | ORAL_TABLET | Freq: Four times a day (QID) | ORAL | Status: DC | PRN
  Administered 2019-01-28 – 2019-01-29 (×3): 50 mg via ORAL
  Filled 2019-01-27 (×4): qty 1

## 2019-01-27 MED ORDER — FENTANYL CITRATE (PF) 250 MCG/5ML IJ SOLN
INTRAMUSCULAR | Status: AC
  Filled 2019-01-27: qty 5

## 2019-01-27 MED ORDER — KETAMINE HCL 10 MG/ML IJ SOLN
INTRAMUSCULAR | Status: AC
  Filled 2019-01-27: qty 1

## 2019-01-27 MED ORDER — DOCUSATE SODIUM 100 MG PO CAPS
100.0000 mg | ORAL_CAPSULE | Freq: Two times a day (BID) | ORAL | Status: DC
  Administered 2019-01-27 – 2019-01-30 (×6): 100 mg via ORAL
  Filled 2019-01-27 (×6): qty 1

## 2019-01-27 MED ORDER — HYDRALAZINE HCL 20 MG/ML IJ SOLN: 10.0000 mg | INTRAMUSCULAR | Status: DC | PRN

## 2019-01-27 MED ORDER — APREPITANT 40 MG PO CAPS
40.0000 mg | ORAL_CAPSULE | ORAL | Status: AC
  Administered 2019-01-27: 40 mg via ORAL
  Filled 2019-01-27: qty 1

## 2019-01-27 MED ORDER — PHENYLEPHRINE HCL-NACL 10-0.9 MG/250ML-% IV SOLN
INTRAVENOUS | Status: AC
  Filled 2019-01-27: qty 250

## 2019-01-27 MED ORDER — ROCURONIUM BROMIDE 50 MG/5ML IV SOSY
PREFILLED_SYRINGE | INTRAVENOUS | Status: DC | PRN
  Administered 2019-01-27 (×2): 20 mg via INTRAVENOUS
  Administered 2019-01-27: 10 mg via INTRAVENOUS
  Administered 2019-01-27: 60 mg via INTRAVENOUS

## 2019-01-27 MED ORDER — PHENYLEPHRINE 40 MCG/ML (10ML) SYRINGE FOR IV PUSH (FOR BLOOD PRESSURE SUPPORT)
PREFILLED_SYRINGE | INTRAVENOUS | Status: AC
  Filled 2019-01-27: qty 10

## 2019-01-27 MED ORDER — KETAMINE HCL 10 MG/ML IJ SOLN
INTRAMUSCULAR | Status: DC | PRN
  Administered 2019-01-27: 30 mg via INTRAVENOUS

## 2019-01-27 MED ORDER — "VISTASEAL 4 ML SINGLE DOSE KIT "
4.0000 mL | PACK | Freq: Once | CUTANEOUS | Status: AC
  Administered 2019-01-27: 4 mL via TOPICAL
  Filled 2019-01-27: qty 4

## 2019-01-27 MED ORDER — SCOPOLAMINE 1 MG/3DAYS TD PT72
1.0000 | MEDICATED_PATCH | TRANSDERMAL | Status: AC
  Administered 2019-01-27: 07:00:00 1 via TRANSDERMAL
  Administered 2019-01-27: 1.5 mg via TRANSDERMAL
  Filled 2019-01-27: qty 1

## 2019-01-27 MED ORDER — ONDANSETRON HCL 4 MG/2ML IJ SOLN
INTRAMUSCULAR | Status: DC | PRN
  Administered 2019-01-27: 4 mg via INTRAVENOUS

## 2019-01-27 MED ORDER — ENOXAPARIN SODIUM 30 MG/0.3ML ~~LOC~~ SOLN
30.0000 mg | Freq: Two times a day (BID) | SUBCUTANEOUS | Status: DC
  Administered 2019-01-27 – 2019-01-30 (×6): 30 mg via SUBCUTANEOUS
  Filled 2019-01-27 (×6): qty 0.3

## 2019-01-27 MED ORDER — ACETAMINOPHEN 500 MG PO TABS
1000.0000 mg | ORAL_TABLET | Freq: Three times a day (TID) | ORAL | Status: DC
  Administered 2019-01-28 – 2019-01-30 (×5): 1000 mg via ORAL
  Filled 2019-01-27 (×6): qty 2

## 2019-01-27 MED ORDER — KETOROLAC TROMETHAMINE 15 MG/ML IJ SOLN
15.0000 mg | INTRAMUSCULAR | Status: DC
  Administered 2019-01-27: 15 mg via INTRAVENOUS
  Filled 2019-01-27: qty 1

## 2019-01-27 MED ORDER — LACTATED RINGERS IV SOLN
INTRAVENOUS | Status: DC
  Administered 2019-01-27 (×2): via INTRAVENOUS

## 2019-01-27 MED ORDER — EPHEDRINE SULFATE-NACL 50-0.9 MG/10ML-% IV SOSY
PREFILLED_SYRINGE | INTRAVENOUS | Status: DC | PRN
  Administered 2019-01-27: 5 mg via INTRAVENOUS
  Administered 2019-01-27: 10 mg via INTRAVENOUS
  Administered 2019-01-27: 5 mg via INTRAVENOUS
  Administered 2019-01-27 (×2): 10 mg via INTRAVENOUS

## 2019-01-27 MED ORDER — AMLODIPINE BESYLATE 10 MG PO TABS
10.0000 mg | ORAL_TABLET | Freq: Every day | ORAL | Status: DC
  Administered 2019-01-27 – 2019-01-29 (×3): 10 mg via ORAL
  Filled 2019-01-27 (×3): qty 1

## 2019-01-27 MED ORDER — BUPROPION HCL ER (XL) 150 MG PO TB24
150.0000 mg | ORAL_TABLET | Freq: Every day | ORAL | Status: DC
  Administered 2019-01-27 – 2019-01-29 (×3): 150 mg via ORAL
  Filled 2019-01-27 (×3): qty 1

## 2019-01-27 MED ORDER — VISTASEAL 10 ML SINGLE DOSE KIT
10.0000 mL | PACK | Freq: Once | CUTANEOUS | Status: AC
  Administered 2019-01-27: 10 mL via TOPICAL
  Filled 2019-01-27: qty 10

## 2019-01-27 MED ORDER — DEXAMETHASONE SODIUM PHOSPHATE 4 MG/ML IJ SOLN: 4.0000 mg | INTRAMUSCULAR | Status: DC

## 2019-01-27 MED ORDER — SODIUM CHLORIDE 0.9 % IV SOLN
INTRAVENOUS | Status: DC
  Administered 2019-01-27 – 2019-01-29 (×8): via INTRAVENOUS

## 2019-01-27 MED ORDER — PROPOFOL 10 MG/ML IV BOLUS
INTRAVENOUS | Status: DC | PRN
  Administered 2019-01-27: 180 mg via INTRAVENOUS

## 2019-01-27 MED ORDER — BUPIVACAINE HCL (PF) 0.25 % IJ SOLN
INTRAMUSCULAR | Status: AC
  Filled 2019-01-27: qty 30

## 2019-01-27 MED ORDER — ENSURE MAX PROTEIN PO LIQD
2.0000 [oz_av] | ORAL | Status: DC
  Administered 2019-01-28 – 2019-01-30 (×17): 2 [oz_av] via ORAL

## 2019-01-27 MED ORDER — METHOCARBAMOL 500 MG IVPB - SIMPLE MED
INTRAVENOUS | Status: AC
  Filled 2019-01-27: qty 50

## 2019-01-27 MED ORDER — ACETAMINOPHEN 500 MG PO TABS
1000.0000 mg | ORAL_TABLET | ORAL | Status: AC
  Administered 2019-01-27: 1000 mg via ORAL
  Filled 2019-01-27: qty 2

## 2019-01-27 MED ORDER — MIDAZOLAM HCL 2 MG/2ML IJ SOLN
INTRAMUSCULAR | Status: AC
  Filled 2019-01-27: qty 2

## 2019-01-27 MED ORDER — BUPIVACAINE HCL (PF) 0.25 % IJ SOLN
INTRAMUSCULAR | Status: DC | PRN
  Administered 2019-01-27: 30 mL

## 2019-01-27 MED ORDER — PANTOPRAZOLE SODIUM 40 MG IV SOLR
40.0000 mg | Freq: Every day | INTRAVENOUS | Status: DC
  Administered 2019-01-27 – 2019-01-29 (×3): 40 mg via INTRAVENOUS
  Filled 2019-01-27 (×3): qty 40

## 2019-01-27 MED ORDER — FENTANYL CITRATE (PF) 100 MCG/2ML IJ SOLN
INTRAMUSCULAR | Status: AC
  Administered 2019-01-27: 50 ug via INTRAVENOUS
  Filled 2019-01-27: qty 2

## 2019-01-27 MED ORDER — MIDAZOLAM HCL 5 MG/5ML IJ SOLN
INTRAMUSCULAR | Status: DC | PRN
  Administered 2019-01-27: 2 mg via INTRAVENOUS

## 2019-01-27 MED ORDER — SUCCINYLCHOLINE CHLORIDE 200 MG/10ML IV SOSY
PREFILLED_SYRINGE | INTRAVENOUS | Status: AC
  Filled 2019-01-27: qty 10

## 2019-01-27 MED ORDER — LIDOCAINE 2% (20 MG/ML) 5 ML SYRINGE
INTRAMUSCULAR | Status: DC | PRN
  Administered 2019-01-27: 80 mg via INTRAVENOUS

## 2019-01-27 MED ORDER — GABAPENTIN 300 MG PO CAPS
300.0000 mg | ORAL_CAPSULE | Freq: Three times a day (TID) | ORAL | Status: DC
  Administered 2019-01-27 – 2019-01-30 (×8): 300 mg via ORAL
  Filled 2019-01-27 (×8): qty 1

## 2019-01-27 MED ORDER — ONDANSETRON HCL 4 MG/2ML IJ SOLN: 4.0000 mg | Freq: Four times a day (QID) | INTRAMUSCULAR | Status: DC | PRN

## 2019-01-27 MED ORDER — LIDOCAINE HCL 2 % IJ SOLN
INTRAMUSCULAR | Status: AC
  Filled 2019-01-27: qty 20

## 2019-01-27 MED ORDER — EPHEDRINE 5 MG/ML INJ
INTRAVENOUS | Status: AC
  Filled 2019-01-27: qty 10

## 2019-01-27 MED ORDER — HYDROMORPHONE HCL 1 MG/ML IJ SOLN
0.5000 mg | INTRAMUSCULAR | Status: DC | PRN
  Administered 2019-01-27 – 2019-01-29 (×5): 0.5 mg via INTRAVENOUS
  Filled 2019-01-27 (×5): qty 0.5

## 2019-01-27 MED ORDER — ONDANSETRON HCL 4 MG/2ML IJ SOLN
INTRAMUSCULAR | Status: AC
  Filled 2019-01-27: qty 2

## 2019-01-27 MED ORDER — PROPOFOL 10 MG/ML IV BOLUS
INTRAVENOUS | Status: AC
  Filled 2019-01-27: qty 20

## 2019-01-27 SURGICAL SUPPLY — 85 items
APPLICATOR COTTON TIP 6 STRL (MISCELLANEOUS) IMPLANT
APPLICATOR COTTON TIP 6IN STRL (MISCELLANEOUS)
APPLICATOR VISTASEAL 35 (MISCELLANEOUS) ×3 IMPLANT
APPLIER CLIP ROT 13.4 12 LRG (CLIP)
BENZOIN TINCTURE PRP APPL 2/3 (GAUZE/BANDAGES/DRESSINGS) ×3 IMPLANT
BLADE SURG SZ11 CARB STEEL (BLADE) ×3 IMPLANT
BNDG ADH 1X3 SHEER STRL LF (GAUZE/BANDAGES/DRESSINGS) ×18 IMPLANT
CABLE HIGH FREQUENCY MONO STRZ (ELECTRODE) ×3 IMPLANT
CHLORAPREP W/TINT 26 (MISCELLANEOUS) ×6 IMPLANT
CLIP APPLIE ROT 13.4 12 LRG (CLIP) IMPLANT
CLIP SUT LAPRA TY ABSORB (SUTURE) ×6 IMPLANT
CLOSURE WOUND 1/2 X4 (GAUZE/BANDAGES/DRESSINGS) ×1
COVER BACK TABLE 60X90IN (DRAPES) ×2 IMPLANT
COVER SURGICAL LIGHT HANDLE (MISCELLANEOUS) ×3 IMPLANT
COVER WAND RF STERILE (DRAPES) IMPLANT
DERMABOND ADVANCED (GAUZE/BANDAGES/DRESSINGS)
DERMABOND ADVANCED .7 DNX12 (GAUZE/BANDAGES/DRESSINGS) IMPLANT
DEVICE SUT QUICK LOAD TK 5 (STAPLE) ×1 IMPLANT
DEVICE SUT TI-KNOT TK 5X26 (MISCELLANEOUS) ×1 IMPLANT
DEVICE SUTURE ENDOST 10MM (ENDOMECHANICALS) ×3 IMPLANT
DEVICE TI KNOT TK5 (MISCELLANEOUS) ×1
DRAIN PENROSE 18X1/4 LTX STRL (WOUND CARE) ×3 IMPLANT
DRAPE CV SPLIT W-CLR ANES SCRN (DRAPES) ×2 IMPLANT
DRAPE UTILITY XL STRL (DRAPES) ×2 IMPLANT
ELECT REM PT RETURN 15FT ADLT (MISCELLANEOUS) ×3 IMPLANT
GAUZE 4X4 16PLY RFD (DISPOSABLE) ×3 IMPLANT
GLOVE BIO SURGEON STRL SZ 6 (GLOVE) ×3 IMPLANT
GLOVE BIOGEL M 8.0 STRL (GLOVE) ×2 IMPLANT
GLOVE BIOGEL PI IND STRL 7.5 (GLOVE) IMPLANT
GLOVE BIOGEL PI INDICATOR 7.5 (GLOVE) ×8
GLOVE INDICATOR 6.5 STRL GRN (GLOVE) ×3 IMPLANT
GLOVE SURG SS PI 7.0 STRL IVOR (GLOVE) ×2 IMPLANT
GOWN STRL REUS W/TWL LRG LVL3 (GOWN DISPOSABLE) ×3 IMPLANT
GOWN STRL REUS W/TWL XL LVL3 (GOWN DISPOSABLE) ×9 IMPLANT
HOVERMATT SINGLE USE (MISCELLANEOUS) ×3 IMPLANT
KIT BASIN OR (CUSTOM PROCEDURE TRAY) ×3 IMPLANT
KIT GASTRIC LAVAGE 34FR ADT (SET/KITS/TRAYS/PACK) ×3 IMPLANT
KIT TURNOVER KIT A (KITS) IMPLANT
MARKER SKIN DUAL TIP RULER LAB (MISCELLANEOUS) ×3 IMPLANT
NDL SPNL 22GX3.5 QUINCKE BK (NEEDLE) ×1 IMPLANT
NEEDLE SPNL 22GX3.5 QUINCKE BK (NEEDLE) ×3 IMPLANT
QUICK LOAD TK 5 (STAPLE) ×1
RELOAD ENDO STITCH 2.0 (ENDOMECHANICALS) ×22
RELOAD STAPLE 60 2.6 WHT THN (STAPLE) ×2 IMPLANT
RELOAD STAPLE 60 3.6 BLU REG (STAPLE) ×2 IMPLANT
RELOAD STAPLE 60 3.8 GOLD REG (STAPLE) IMPLANT
RELOAD STAPLE 60 4.1 GRN THCK (STAPLE) ×1 IMPLANT
RELOAD STAPLER BLUE 60MM (STAPLE) ×5 IMPLANT
RELOAD STAPLER GOLD 60MM (STAPLE) IMPLANT
RELOAD STAPLER GREEN 60MM (STAPLE) ×1 IMPLANT
RELOAD STAPLER WHITE 60MM (STAPLE) ×2 IMPLANT
RELOAD SUT SNGL STCH ABSRB 2-0 (ENDOMECHANICALS) ×4 IMPLANT
RELOAD SUT SNGL STCH BLK 2-0 (ENDOMECHANICALS) ×4 IMPLANT
SCISSORS LAP 5X45 EPIX DISP (ENDOMECHANICALS) ×3 IMPLANT
SET IRRIG TUBING LAPAROSCOPIC (IRRIGATION / IRRIGATOR) ×3 IMPLANT
SET TUBE SMOKE EVAC HIGH FLOW (TUBING) ×3 IMPLANT
SHEARS HARMONIC ACE PLUS 45CM (MISCELLANEOUS) ×3 IMPLANT
SLEEVE XCEL OPT CAN 5 100 (ENDOMECHANICALS) ×9 IMPLANT
SOL ANTI FOG 6CC (MISCELLANEOUS) ×1 IMPLANT
SOLUTION ANTI FOG 6CC (MISCELLANEOUS) ×2
STAPLER ECHELON BIOABSB 60 FLE (MISCELLANEOUS) IMPLANT
STAPLER ECHELON LONG 60 440 (INSTRUMENTS) ×3 IMPLANT
STAPLER RELOAD BLUE 60MM (STAPLE) ×15
STAPLER RELOAD GOLD 60MM (STAPLE)
STAPLER RELOAD GREEN 60MM (STAPLE) ×3
STAPLER RELOAD WHITE 60MM (STAPLE) ×6
STRIP CLOSURE SKIN 1/2X4 (GAUZE/BANDAGES/DRESSINGS) ×2 IMPLANT
SUT MNCRL AB 4-0 PS2 18 (SUTURE) ×3 IMPLANT
SUT RELOAD ENDO STITCH 2 48X1 (ENDOMECHANICALS) ×7
SUT RELOAD ENDO STITCH 2.0 (ENDOMECHANICALS) ×4
SUT SURGIDAC NAB ES-9 0 48 120 (SUTURE) ×2 IMPLANT
SUT VIC AB 2-0 SH 27 (SUTURE) ×2
SUT VIC AB 2-0 SH 27X BRD (SUTURE) ×1 IMPLANT
SUTURE RELOAD END STTCH 2 48X1 (ENDOMECHANICALS) ×7 IMPLANT
SUTURE RELOAD ENDO STITCH 2.0 (ENDOMECHANICALS) ×4 IMPLANT
SYR 10ML ECCENTRIC (SYRINGE) ×3 IMPLANT
SYR 20ML LL LF (SYRINGE) ×6 IMPLANT
TOWEL OR 17X26 10 PK STRL BLUE (TOWEL DISPOSABLE) ×3 IMPLANT
TOWEL OR NON WOVEN STRL DISP B (DISPOSABLE) ×3 IMPLANT
TRAY FOLEY MTR SLVR 14FR STAT (SET/KITS/TRAYS/PACK) ×2 IMPLANT
TROCAR BLADELESS OPT 5 100 (ENDOMECHANICALS) IMPLANT
TROCAR UNIVERSAL OPT 12M 100M (ENDOMECHANICALS) ×3 IMPLANT
TROCAR XCEL 12X100 BLDLESS (ENDOMECHANICALS) ×3 IMPLANT
TUBING CONNECTING 10 (TUBING) ×1 IMPLANT
TUBING CONNECTING 10' (TUBING) ×1

## 2019-01-27 NOTE — Progress Notes (Signed)
Discussed post op day goals with patient including ambulation, IS, diet progression, pain, and nausea control.  BSTOP education provided including BSTOP information guide, "Guide for Pain Management after your Bariatric Procedure".  Questions answered. 

## 2019-01-27 NOTE — Anesthesia Procedure Notes (Signed)
Procedure Name: Intubation Date/Time: 01/27/2019 7:23 AM Performed by: Maxwell Caul, CRNA Pre-anesthesia Checklist: Patient identified, Emergency Drugs available, Suction available and Patient being monitored Patient Re-evaluated:Patient Re-evaluated prior to induction Oxygen Delivery Method: Circle system utilized Preoxygenation: Pre-oxygenation with 100% oxygen Induction Type: IV induction Laryngoscope Size: Mac and 4 Grade View: Grade I Tube type: Oral Tube size: 7.5 mm Number of attempts: 1 Airway Equipment and Method: Stylet Placement Confirmation: ETT inserted through vocal cords under direct vision,  positive ETCO2 and breath sounds checked- equal and bilateral Secured at: 21 cm Tube secured with: Tape Dental Injury: Teeth and Oropharynx as per pre-operative assessment

## 2019-01-27 NOTE — Op Note (Signed)
Operative Note  Jenny Ortiz  [id number]  [id number]  01/27/2019   Surgeon: Phylliss Blakeshelsea Jeanetta Alonzo MD FACS   Assistant: Wenda LowMatt Martin MD FACS   Procedure performed: laparoscopic Roux-en-Y gastric bypass ( antecolic, antegastric), hiatal hernia repair, upper endoscopy   Preop diagnosis: Morbid obesity Body mass index is 43.15 kg/m., hyperlipidema, hypertension, GERD, history of pulmonary embolus Post-op diagnosis/intraop findings: same, hiatal hernia   Specimens: none Retained items: none  EBL: minimal cc Complications: none   Description of procedure: After obtaining informed consent and administration of prophylactic lovenox in holding, the patient was taken to the operating room and placed supine on operating room table where general endotracheal anesthesia was initiated, preoperative antibiotics were administered, SCDs applied, and a formal timeout was performed. The abdomen was prepped and draped in usual sterile fashion. Peritoneal access was gained using a Visiport technique in the left upper quadrant and insufflation to 15 mmHg ensued without issue. Gross inspection revealed no evidence of injury. Under direct visualization the remaining trochars were inserted. A laparoscopic assisted bilateral taps block was performed using Exparel mixed with Marcaine.   The omentum was reflected cephalad and the ligament of Treitz identified. The small bowel was followed to a point 40 cm distal to ligament of Treitz at which location the bowel was divided with a white load linear cutting stapler. A Penrose was sutured to the Roux side of the staple line for future identification. The bowel was measured another 100 cm distal to this and and the site for the jejunojejunostomy was aligned with the end of the biliopancreatic limb. Enterotomies were made with the Harmonic scalpel and the anastomosis was created with the 60 mm white load linear cutting stapler. The common enterotomy was closed with running 3-0 Vicryl  starting on either end and tying centrally. Two additional simple sutures were used to oversew the suture line. The mesenteric defect was closed with running silk suture secured with Lapra-Ty's. The anastomosis was inspected and appeared widely patent, hemostatic with no gaps in the suture line. Vistaseal was injected over the anastomosis. We then divided the omentum using the harmonic scalpel.   The patient was then placed in steep reverse Trendelenburg. The liver retractor was inserted through a subxiphoid incision and secured for fixed retraction of the left lobe.  At the hiatus there was noticeably a small hiatal hernia.  The pars lucida was divided the posterior crura dissected out. Of note she has a lobular protrusion of the caudate lobe which is gently retracted to perform the crural repair.  Hiatus was narrowed with a single 0 Ethibond stitch secured with a ty-knot.  The Harmonic scalpel was used to enter the perigastric plane and the lesser sac at a point 5 cm distal to the GE junction on the lesser curve. The angle of His was gently bluntly dissected in the target shape of the pouch visualized to exclude any residual fundus. After confirming that all tubes have been removed from the stomach, the gastric pouch was created with serial fires of the linear cutting stapler. As we approached the angle of His, the Ewald tube was inserted to confirm no impingement on the GE junction. The Roux limb with its attached Penrose drain was then identified and brought up to meet the gastric pouch ensuring no twist in the small bowel mesentery. The staple line of the small bowel is directed to the patient's left side. A running 3-0 Vicryl was used to create a posterior suture line for our anastomosis between the gastric  pouch and the small bowel. Gastrotomy and enterotomy was made with the Harmonic scalpel and a blue load linear cutting stapler was used to create a gastrojejunal anastomosis approximately 2.5cm wide. The  common enterotomy was closed with running 3-0 Vicryl starting at either end and tying centrally. At this juncture the Ewald tube was passed through the gastrojejunal anastomosis. An anterior layer of running 3-0 Vicryl was used to complete the gastrojejunal anastomosis. The ewald tube was removed without difficulty. The [place] space was closed with a figure-of-eight silk suture.   At this point the assistant performed an upper endoscopy with the Roux limb gently clamped with a bowel clamp. Irrigation is instilled in the upper abdomen for a leak test. Please see a separate operative note- the anastomosis is noted to be patent and hemostatic without any leak or bubbles present. The endoscope was removed and the abdomen once again surveyed. The remaining vistaseal was sprayed over the gastrojejunal anastomosis. The liver retractor was removed under direct visualization. The abdomen was then desufflated and all remaining trochars removed. The skin incisions were closed with running subcuticular Monocryl; benzoin, Steri-Strips and Band-Aids were applied The patient was then awakened, extubated and taken to PACU in stable condition.     All counts were correct at the completion of the case.

## 2019-01-27 NOTE — Anesthesia Postprocedure Evaluation (Signed)
Anesthesia Post Note  Patient: Jenny Ortiz  Procedure(s) Performed: LAPAROSCOPIC ROUX-EN-Y GASTRIC BYPASS WITH HIATAL HERNIA REPAIR, Upper Endo, ERAS Pathway (N/A Abdomen)     Patient location during evaluation: PACU Anesthesia Type: General Level of consciousness: awake and alert Pain management: pain level controlled Vital Signs Assessment: post-procedure vital signs reviewed and stable Respiratory status: spontaneous breathing, nonlabored ventilation, respiratory function stable and patient connected to nasal cannula oxygen Cardiovascular status: blood pressure returned to baseline and stable Postop Assessment: no apparent nausea or vomiting Anesthetic complications: no    Last Vitals:  Vitals:   01/27/19 1313 01/27/19 1410  BP: (!) 161/94 130/87  Pulse: 95 92  Resp: 18 18  Temp: 36.7 C 36.8 C  SpO2: 98%     Last Pain:  Vitals:   01/27/19 1410  TempSrc: Oral  PainSc:                  Dedham S

## 2019-01-27 NOTE — Progress Notes (Signed)
Pt started drinking first 2oz cup of water at 1700. 

## 2019-01-27 NOTE — Op Note (Signed)
Jenny Ortiz [id number] 10-27-1965 01/27/2019  Preoperative diagnosis: morbid obesity--undergoing lap roux en Y gastric bypass and hiatal hernia repair  Postoperative diagnosis: Same   Procedure: Upper endoscopy   Surgeon: Catalina Antigua B. Hassell Done  M.D., FACS   Anesthesia: Gen.   Indications for procedure: This patient was undergoing a lap hiatal hernia repair and gastric bypass.    Description of procedure: The endoscopy was placed in the mouth and into the oropharynx and under endoscopic vision it was advanced to the esophagogastric junction.  The pouch was insufflated and was ~ 5 cm in length.  The anastomosis was visualized and appeared to be of proper size.  .   No bleeding or leaks were detected in the pouch or anastomosis.  The scope was withdrawn without difficulty.     Matt B. Hassell Done, MD, FACS General, Bariatric, & Minimally Invasive Surgery [facility], [place]

## 2019-01-27 NOTE — Transfer of Care (Signed)
Immediate Anesthesia Transfer of Care Note  Patient: Jenny Ortiz  Procedure(s) Performed: LAPAROSCOPIC ROUX-EN-Y GASTRIC BYPASS WITH HIATAL HERNIA REPAIR, Upper Endo, ERAS Pathway (N/A Abdomen)  Patient Location: PACU  Anesthesia Type:General  Level of Consciousness: awake, alert  and oriented  Airway & Oxygen Therapy: Patient Spontanous Breathing and Patient connected to face mask oxygen  Post-op Assessment: Report given to RN and Post -op Vital signs reviewed and stable  Post vital signs: Reviewed and stable  Last Vitals:  Vitals Value Taken Time  BP    Temp    Pulse    Resp    SpO2      Last Pain:  Vitals:   01/27/19 0630  TempSrc:   PainSc: 0-No pain         Complications: No apparent anesthesia complications

## 2019-01-27 NOTE — Anesthesia Preprocedure Evaluation (Signed)
Anesthesia Evaluation  Patient identified by MRN, date of birth, ID band Patient awake    Reviewed: Allergy & Precautions, H&P , NPO status , Patient's Chart, lab work & pertinent test results  Airway Mallampati: II   Neck ROM: full    Dental   Pulmonary former smoker,    breath sounds clear to auscultation       Cardiovascular hypertension,  Rhythm:regular Rate:Normal     Neuro/Psych PSYCHIATRIC DISORDERS Anxiety Depression  Neuromuscular disease    GI/Hepatic hiatal hernia, GERD  ,  Endo/Other    Renal/GU      Musculoskeletal   Abdominal   Peds  Hematology   Anesthesia Other Findings   Reproductive/Obstetrics                             Anesthesia Physical Anesthesia Plan  ASA: II  Anesthesia Plan: General   Post-op Pain Management:    Induction: Intravenous  PONV Risk Score and Plan: 3 and Ondansetron, Dexamethasone, Midazolam, Treatment may vary due to age or medical condition and Scopolamine patch - Pre-op  Airway Management Planned: Oral ETT  Additional Equipment:   Intra-op Plan:   Post-operative Plan: Extubation in OR  Informed Consent: I have reviewed the patients History and Physical, chart, labs and discussed the procedure including the risks, benefits and alternatives for the proposed anesthesia with the patient or authorized representative who has indicated his/her understanding and acceptance.       Plan Discussed with: CRNA, Anesthesiologist and Surgeon  Anesthesia Plan Comments:         Anesthesia Quick Evaluation

## 2019-01-27 NOTE — H&P (Signed)
Surgical H&P  CC: morbid obesity  HPI: Follows up for management of severe obesity. Labs completed last year noted only mildly low B12 and vit D which were addressed by PCP. Last visit with dieticians was 05/01/18. Did not have psych eval until 10/13/18 [facility] Chest x-ray completed September 2017 with bilateral interstitial prominence which may reflect smoking history, chronic bronchitis or reactive airway disease, no pneumonia or pulmonary edema. Her upper GI did demonstrate a small intermittent type I hiatal hernia but otherwise was normal.  She underwent a laparoscopic hysterectomy and bilateral salpingo-oophorectomy 11/07/18 for postmenopausal bleeding.  She recovered very quickly with minimal pain.  She states that her gynecologist has cleared her to proceed with any further bariatric surgery.  Her preoperative classes scheduled for August 3.  She is tentatively scheduled for Roux-en-Y gastric bypass on August 31.  She reports she quit smoking in March.  She feels confident that she'll never want to smoke again.  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  Initial visit 11/28/17, Dr Johna SheriffHoxworth: "The patient is a 53 year old female who presents with obesity. Patient presents for consideration for surgical treatment for morbid obesity. She is the twin sister of a previous gastric bypass patient of mine who has done very well. The patient gives a history of progressive obesity since early adulthood despite multiple attempts at medical management. She has been labile to lose up to 50 pounds at a time but then experiences progressive weight regain Obesity has been affecting the patient in a number of ways including difficulties with routine activities and fatigue as well as increasing joint pain.. Significant co-morbid illnesses have developed including hypertension, elevated cholesterol, and reflux requiring daily medications. The patient has been to our initial  information seminar, researched surgical options thoroughly, and is interested in gastric bypass due to her severe reflux. She is a current cigarette smoker. She has a remote history of pulmonary embolus about 11 years ago after hospitalization for pneumonia and was on birth control pills. Wt 218lb, BMI 41.19.   OBESITY, MORBID, BMI 40.0-49.9 (E66.01) Impression: Patient with progressive morbid obesity unresponsive to multiple efforts at medical management who presents with a BMI of 41 and comorbidities of hypertension, elevated cholesterol, GERD and chronic joint pain. I believe there would be very significant medical benefit from surgical weight loss. After our discussion of surgical options currently available the patient has decided to proceed with laparoscopic Roux-en-Y gastric bypass due to the reasons above. We have discussed the nature of the problem and the risks of remaining morbidly obese. We discussed laparoscopic Roux-en-Y gastric bypass in detail including the nature of the procedure, expected hospitalization and recovery, and major risks of anesthetic complications, bleeding, blood clots and pulmonary emboli, leakage and infection and long-term risks of stricture, ulceration, bowel obstruction, nutritional deficiencies, and failure to lose weight or weight regain. We discussed these problems could lead to death. The patient was given a complete consent form to review and all questions were answered. We will go ahead with preoperative evaluation including psychological and nutrition evaluations, H. pylori testing, ultrasound, bone density, and routine lab and x-rays. I will see the patient back following these studies. We discussed that she will have to quit smoking and we will check a nicotine level preoperatively. We also discussed her previous history of pulmonary embolus which was remote and she was on birth control pills. We would likely consider more prolonged postoperative  prophylaxis.   HYPERTENSION, BENIGN (I10)  HYPERLIPIDEMIA, MILD (E78.5)  CHRONIC GERD (K21.9)  HISTORY OF PULMONARY EMBOLISM (Z86.711)"      Allergies  Allergen Reactions  . Amoxicillin Diarrhea        Past Medical History:  Diagnosis Date  . Hypertension     No past surgical history on file.       Family History  Problem Relation Age of Onset  . Cancer Other   . Hypertension Other   . Heart disease Other     Social History        Socioeconomic History  . Marital status: Divorced    Spouse name: Not on file  . Number of children: Not on file  . Years of education: Not on file  . Highest education level: Not on file  Occupational History  . Not on file  Social Needs  . Financial resource strain: Not on file  . Food insecurity    Worry: Not on file    Inability: Not on file  . Transportation needs    Medical: Not on file    Non-medical: Not on file  Tobacco Use  . Smoking status: Not on file  Substance and Sexual Activity  . Alcohol use: Not on file  . Drug use: Not on file  . Sexual activity: Not on file  Lifestyle  . Physical activity    Days per week: Not on file    Minutes per session: Not on file  . Stress: Not on file  Relationships  . Social Musician on phone: Not on file    Gets together: Not on file    Attends religious service: Not on file    Active member of club or organization: Not on file    Attends meetings of clubs or organizations: Not on file    Relationship status: Not on file  Other Topics Concern  . Not on file  Social History Narrative  . Not on file          Current Outpatient Medications on File Prior to Visit  Medication Sig Dispense Refill  . amLODipine (NORVASC) 10 MG tablet Take 10 mg by mouth daily.    . Cholecalciferol (VITAMIN D) 2000 units CAPS Take by mouth.    . cyanocobalamin 1000 MCG tablet Take 1,000 mcg by mouth daily.    Marland Kitchen escitalopram  (LEXAPRO) 20 MG tablet Take 20 mg by mouth daily.    Marland Kitchen lisinopril (PRINIVIL,ZESTRIL) 20 MG tablet Take 20 mg by mouth daily.    Marland Kitchen omeprazole (PRILOSEC) 20 MG capsule Take 20 mg by mouth daily.    . simvastatin (ZOCOR) 40 MG tablet Take 40 mg by mouth daily.     No current facility-administered medications on file prior to visit.     Review of Systems: a complete, 10pt review of systems was completed with pertinent positives and negatives as documented in the HPI  Physical Exam: Vitals (Sabrina Canty CMA; 12/27/2018 2:21 PM) 12/27/2018 2:20 PM Weight: 239.13 lb Height: 61in Body Surface Area: 2.04 m Body Mass Index: 45.18 kg/m  Temp.: 97.35F(Temporal)  Pulse: 100 (Regular)  P.OX: 96% (Room air) BP: 152/90 (Sitting, Left Arm, Standard)  Gen: alert and well appearing Eye: extraocular motion intact, no scleral icterus ENT: moist mucus membranes, dentition intact Neck: no mass or thyromegaly Chest: unlabored respirations, symmetrical air entry, clear bilaterally CV: regular rate and rhythm, no pedal edema Abdomen: soft, nontender, nondistended. No mass or organomegaly. Well-healed laparoscopic port sites 5 across mid abdomen. MSK: strength symmetrical throughout, no  deformity Neuro: grossly intact, normal gait Psych: normal mood and affect, appropriate insight Skin: warm and dry, no rash or lesion on limited exam    No flowsheet data found.  No flowsheet data found.  Recent Labs  No results found for: INR, PROTIME    Imaging: Imaging Results (Last 48 hours)  No results found.     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 We once again discussed the surgery including technique, risks, and typical perioperative course. She is ready to  proceed. We'll plan to keep as scheduled at the end of August, would not do anything sooner than this.     Romana Juniper, MD [facility], [place] Pager [phone]

## 2019-01-27 NOTE — Progress Notes (Signed)
PHARMACY CONSULT FOR:  Risk Assessment for Post-Discharge VTE Following Bariatric Surgery  Post-Discharge VTE Risk Assessment: This patient's probability of 30-day post-discharge VTE is increased due to the factors marked:   Female    Age >/=60 years    BMI >/=50 kg/m2    CHF    Dyspnea at Rest    Paraplegia  X  Non-gastric-band surgery    Operation Time >/=3 hr    Return to OR     Length of Stay >/= 3 d     X Hx of VTE    Hypercoagulable condition   Significant venous stasis   Predicted probability of 30-day post-discharge VTE: 0.16%  Other patient-specific factors to consider: hx PE   Recommendation for Discharge: Enoxaparin 40 mg [place] q12h x 4 weeks post-discharge   Jenny Ortiz is a 53 y.o. female who underwent laparoscopic Roux-en-Y gastric bypass  on 01/27/2019   Case start: 0747 Case end: 1037   Allergies  Allergen Reactions  . Amoxicillin Diarrhea    Did it involve swelling of the face/tongue/throat, SOB, or low BP? No Did it involve sudden or severe rash/hives, skin peeling, or any reaction on the inside of your mouth or nose? No Did you need to seek medical attention at a hospital or doctor's office? No When did it last happen?4 years ago If all above answers are "NO", may proceed with cephalosporin use.     Patient Measurements: Height: 5\' 1"  (154.9 cm) Weight: 228 lb 6 oz (103.6 kg) IBW/kg (Calculated) : 47.8 Body mass index is 43.15 kg/m.  No results for input(s): WBC, HGB, HCT, PLT, APTT, CREATININE, LABCREA, CREATININE, CREAT24HRUR, MG, PHOS, ALBUMIN, PROT, ALBUMIN, AST, ALT, ALKPHOS, BILITOT, BILIDIR, IBILI in the last 72 hours. Estimated Creatinine Clearance: 85.7 mL/min (by C-G formula based on SCr of 0.83 mg/dL).    Past Medical History:  Diagnosis Date  . Anxiety   . Bilateral carpal tunnel syndrome   . Complex endometrial hyperplasia   . Depression   . GERD (gastroesophageal reflux disease)   . History of hiatal hernia   . Hyperlipidemia    . Hypertension   . Obesity   . PMB (postmenopausal bleeding)   . Pneumonia    age 30, ICU, bilateral  . Pulmonary emboli ([place])    after pneumonia, Warfarin for 18 months     Medications Prior to Admission  Medication Sig Dispense Refill Last Dose  . amLODipine (NORVASC) 10 MG tablet Take 10 mg by mouth at bedtime.    01/26/2019 at 2200  . buPROPion (WELLBUTRIN XL) 150 MG 24 hr tablet Take 150 mg by mouth at bedtime.    01/26/2019 at 2200  . escitalopram (LEXAPRO) 20 MG tablet Take 20 mg by mouth at bedtime.    01/26/2019 at 2200  . omeprazole (PRILOSEC) 20 MG capsule Take 20 mg by mouth at bedtime.    01/26/2019 at 2200  . simvastatin (ZOCOR) 40 MG tablet Take 40 mg by mouth at bedtime.    01/26/2019 at 2200       Jenny Ortiz P 01/27/2019,12:49 PM

## 2019-01-28 ENCOUNTER — Encounter (HOSPITAL_COMMUNITY): Payer: Self-pay | Admitting: Surgery

## 2019-01-28 LAB — COMPREHENSIVE METABOLIC PANEL
ALT: 84 U/L — ABNORMAL HIGH (ref 0–44)
AST: 65 U/L — ABNORMAL HIGH (ref 15–41)
Albumin: 3.7 g/dL (ref 3.5–5.0)
Alkaline Phosphatase: 73 U/L (ref 38–126)
Anion gap: 7 (ref 5–15)
BUN: 12 mg/dL (ref 6–20)
CO2: 26 mmol/L (ref 22–32)
Calcium: 8.7 mg/dL — ABNORMAL LOW (ref 8.9–10.3)
Chloride: 107 mmol/L (ref 98–111)
Creatinine, Ser: 0.68 mg/dL (ref 0.44–1.00)
GFR calc Af Amer: >60 mL/min (ref >60)
GFR calc non Af Amer: >60 mL/min (ref >60)
Glucose, Bld: 108 mg/dL — ABNORMAL HIGH (ref 70–99)
Potassium: 3.7 mmol/L (ref 3.5–5.1)
Sodium: 140 mmol/L (ref 135–145)
Total Bilirubin: 0.9 mg/dL (ref 0.3–1.2)
Total Protein: 6.5 g/dL (ref 6.5–8.1)

## 2019-01-28 LAB — CBC WITH DIFFERENTIAL/PLATELET
Abs Immature Granulocytes: 0.04 10*3/uL (ref 0.00–0.07)
Basophils Absolute: 0 10*3/uL (ref 0.0–0.1)
Basophils Relative: 0 %
Eosinophils Absolute: 0 10*3/uL (ref 0.0–0.5)
Eosinophils Relative: 0 %
HCT: 37 % (ref 36.0–46.0)
Hemoglobin: 12 g/dL (ref 12.0–15.0)
Immature Granulocytes: 0 %
Lymphocytes Relative: 21 %
Lymphs Abs: 1.9 10*3/uL (ref 0.7–4.0)
MCH: 32.7 pg (ref 26.0–34.0)
MCHC: 32.4 g/dL (ref 30.0–36.0)
MCV: 100.8 fL — ABNORMAL HIGH (ref 80.0–100.0)
Monocytes Absolute: 1.1 10*3/uL — ABNORMAL HIGH (ref 0.1–1.0)
Monocytes Relative: 12 %
Neutro Abs: 5.9 10*3/uL (ref 1.7–7.7)
Neutrophils Relative %: 67 %
Platelets: 224 10*3/uL (ref 150–400)
RBC: 3.67 MIL/uL — ABNORMAL LOW (ref 3.87–5.11)
RDW: 12.4 % (ref 11.5–15.5)
WBC: 9 10*3/uL (ref 4.0–10.5)
nRBC: 0 % (ref 0.0–0.2)

## 2019-01-28 LAB — MAGNESIUM: Magnesium: 2.5 mg/dL — ABNORMAL HIGH (ref 1.7–2.4)

## 2019-01-28 MED ORDER — ENOXAPARIN (LOVENOX) PATIENT EDUCATION KIT
PACK | Freq: Once | Status: AC
  Administered 2019-01-28: 09:00:00
  Filled 2019-01-28: qty 1

## 2019-01-28 MED ORDER — ACETAMINOPHEN 325 MG PO TABS
650.0000 mg | ORAL_TABLET | Freq: Once | ORAL | Status: AC
  Administered 2019-01-28: 19:00:00 650 mg via ORAL
  Filled 2019-01-28: qty 2

## 2019-01-28 NOTE — Progress Notes (Signed)
Patient has a fever of 100.6. notified Dr. Ninfa Linden. MD ordered tylenol 650mg  po as one time dose and ordered to encourage patient to use incentive spirometer

## 2019-01-28 NOTE — Progress Notes (Signed)
S: Slept well. Tolerating liquids without issue. Walked once last night. Reports bilateral abdominal wall muscle soreness.  Vitals, labs, intake/output, and orders reviewed at this time.  Gen: A&Ox3, no distress  H&N: EOMI, atraumatic, neck supple Chest: unlabored respirations, RRR Abd: soft, not tender, nondistended, incisions c/d/i with steris, no cellulitis or hematoma Ext: warm, no edema Neuro: grossly normal  Lines/tubes/drains: PIV  A/P:  POD 1 roux en y gastric bypass Continue liquids Continue lovenox/ SCDs  -Will need lovenox on DC Continue pulm toiler Ambulate more today  Anticipate discharge tomorrow   Romana Juniper, MD [facility], [place] Pager [phone]

## 2019-01-28 NOTE — Progress Notes (Signed)
Nutrition Note  RD consulted for diet education for patient s/p bariatric surgery. While RDs are working remotely, Bariatric nurse coordinator providing education.   If nutrition issues arise, please consult RD.   Jimya Ciani, MS, RD, LDN [place] [facility] Clinical Dietitian Pager: [phone] After Hours Pager: [phone]   

## 2019-01-28 NOTE — Progress Notes (Addendum)
Patient alert and oriented, Post op day 1.  Provided support and encouragement.  Encouraged pulmonary toilet, ambulation and small sips of liquids.  Completed 12 ounces of bari clear fluid and 11.5 ounces protein shake.  All questions answered.  Will continue to monitor.

## 2019-01-28 NOTE — Discharge Instructions (Signed)
Enoxaparin injection What is this medicine? ENOXAPARIN (ee nox a PA rin) is used after knee, hip, or abdominal surgeries to prevent blood clotting. It is also used to treat existing blood clots in the lungs or in the veins. This medicine may be used for other purposes; ask your health care provider or pharmacist if you have questions. COMMON BRAND NAME(S): Lovenox What should I tell my health care provider before I take this medicine? They need to know if you have any of these conditions:  bleeding disorders, hemorrhage, or hemophilia  infection of the heart or heart valves  kidney or liver disease  previous stroke  prosthetic heart valve  recent surgery or delivery of a baby  ulcer in the stomach or intestine, diverticulitis, or other bowel disease  an unusual or allergic reaction to enoxaparin, heparin, pork or pork products, other medicines, foods, dyes, or preservatives  pregnant or trying to get pregnant  breast-feeding How should I use this medicine? This medicine is for injection under the skin. It is usually given by a health-care professional. You or a family member may be trained on how to give the injections. If you are to give yourself injections, make sure you understand how to use the syringe, measure the dose if necessary, and give the injection. To avoid bruising, do not rub the site where this medicine has been injected. Do not take your medicine more often than directed. Do not stop taking except on the advice of your doctor or health care professional. Make sure you receive a puncture-resistant container to dispose of the needles and syringes once you have finished with them. Do not reuse these items. Return the container to your doctor or health care professional for proper disposal. Talk to your pediatrician regarding the use of this medicine in children. Special care may be needed. Overdosage: If you think you have taken too much of this medicine contact a poison  control center or emergency room at once. NOTE: This medicine is only for you. Do not share this medicine with others. What if I miss a dose? If you miss a dose, take it as soon as you can. If it is almost time for your next dose, take only that dose. Do not take double or extra doses. What may interact with this medicine?  aspirin and aspirin-like medicines  certain medicines that treat or prevent blood clots  dipyridamole  NSAIDs, medicines for pain and inflammation, like ibuprofen or naproxen This list may not describe all possible interactions. Give your health care provider a list of all the medicines, herbs, non-prescription drugs, or dietary supplements you use. Also tell them if you smoke, drink alcohol, or use illegal drugs. Some items may interact with your medicine. What should I watch for while using this medicine? Visit your healthcare professional for regular checks on your progress. You may need blood work done while you are taking this medicine. Your condition will be monitored carefully while you are receiving this medicine. It is important not to miss any appointments. If you are going to need surgery or other procedure, tell your healthcare professional that you are using this medicine. Using this medicine for a long time may weaken your bones and increase the risk of bone fractures. Avoid sports and activities that might cause injury while you are using this medicine. Severe falls or injuries can cause unseen bleeding. Be careful when using sharp tools or knives. Consider using an electric razor. Take special care brushing or flossing your   teeth. Report any injuries, bruising, or red spots on the skin to your healthcare professional. Wear a medical ID bracelet or chain. Carry a card that describes your disease and details of your medicine and dosage times. What side effects may I notice from receiving this medicine? Side effects that you should report to your doctor or health  care professional as soon as possible:  allergic reactions like skin rash, itching or hives, swelling of the face, lips, or tongue  bone pain  signs and symptoms of bleeding such as bloody or black, tarry stools; red or dark-brown urine; spitting up blood or brown material that looks like coffee grounds; red spots on the skin; unusual bruising or bleeding from the eye, gums, or nose  signs and symptoms of a blood clot such as chest pain; shortness of breath; pain, swelling, or warmth in the leg  signs and symptoms of a stroke such as changes in vision; confusion; trouble speaking or understanding; severe headaches; sudden numbness or weakness of the face, arm or leg; trouble walking; dizziness; loss of coordination Side effects that usually do not require medical attention (report to your doctor or health care professional if they continue or are bothersome):  hair loss  pain, redness, or irritation at site where injected This list may not describe all possible side effects. Call your doctor for medical advice about side effects. You may report side effects to FDA at 1-800-FDA-1088. Where should I keep my medicine? Keep out of the reach of children. Store at room temperature between 15 and 30 degrees C (59 and 86 degrees F). Do not freeze. If your injections have been specially prepared, you may need to store them in the refrigerator. Ask your pharmacist. Throw away any unused medicine after the expiration date. NOTE: This sheet is a summary. It may not cover all possible information. If you have questions about this medicine, talk to your doctor, pharmacist, or health care provider.  2020 Elsevier/Gold Standard (2017-05-10 11:25:34)     GASTRIC BYPASS/SLEEVE  Home Care Instructions   These instructions are to help you care for yourself when you go home.  Call: If you have any problems. . Call [phone] and ask for the surgeon on call . If you need immediate help, come to the ER  at [place].  . Tell the ER staff that you are a new post-op gastric bypass or gastric sleeve patient   Signs and symptoms to report: . Severe vomiting or nausea o If you cannot keep down clear liquids for longer than 1 day, call your surgeon  . Abdominal pain that does not get better after taking your pain medication . Fever over 100.4 F with chills . Heart beating over 100 beats a minute . Shortness of breath at rest . Chest pain .  Redness, swelling, drainage, or foul odor at incision (surgical) sites .  If your incisions open or pull apart . Swelling or pain in calf (lower leg) . Diarrhea (Loose bowel movements that happen often), frequent watery, uncontrolled bowel movements . Constipation, (no bowel movements for 3 days) if this happens: Pick one o Milk of Magnesia, 2 tablespoons by mouth, 3 times a day for 2 days if needed o Stop taking Milk of Magnesia once you have a bowel movement o Call your doctor if constipation continues Or o Miralax  (instead of Milk of Magnesia) following the label instructions o Stop taking Miralax once you have a bowel movement o Call your doctor if constipation   continues . Anything you think is not normal   Normal side effects after surgery: . Unable to sleep at night or unable to focus . Irritability or moody . Being tearful (crying) or depressed These are common complaints, possibly related to your anesthesia medications that put you to sleep, stress of surgery, and change in lifestyle.  This usually goes away a few weeks after surgery.  If these feelings continue, call your primary care doctor.   Wound Care: You may have surgical glue, steri-strips, or staples over your incisions after surgery . Surgical glue:  Looks like a clear film over your incisions and will wear off a little at a time . Steri-strips: Strips of tape over your incisions. You may notice a yellowish color on the skin under the steri-strips. This is used to make the    steri-strips stick better. Do not pull the steri-strips off - let them fall off . Staples: Staples may be removed before you leave the hospital o If you go home with staples, call [facility], [phone] at for an appointment with your surgeon's nurse to have staples removed 10 days after surgery. . Showering: You may shower two (2) days after your surgery unless your surgeon tells you differently o Wash gently around incisions with warm soapy water, rinse well, and gently pat dry  o No tub baths until staples are removed, steri-strips fall off or glue is gone.    Medications: . Medications should be liquid or crushed if larger than the size of a dime . Extended release pills (medication that release a little bit at a time through the day) should NOT be crushed or cut. (examples include XL, ER, DR, SR) . Depending on the size and number of medications you take, you may need to space (take a few throughout the day)/change the time you take your medications so that you do not over-fill your pouch (smaller stomach) . Make sure you follow-up with your primary care doctor to make medication changes needed during rapid weight loss and life-style changes . If you have diabetes, follow up with the doctor that orders your diabetes medication(s) within one week after surgery and check your blood sugar regularly. . Do not drive while taking prescription pain medication  . It is ok to take Tylenol by the bottle instructions with your pain medicine or instead of your pain medicine as needed.  DO NOT TAKE NSAIDS (EXAMPLES OF NSAIDS:  IBUPROFREN/ NAPROXEN)  Diet:                    First 2 Weeks  You will see the dietician t about two (2) weeks after your surgery. The dietician will increase the types of foods you can eat if you are handling liquids well: . If you have severe vomiting or nausea and cannot keep down clear liquids lasting longer than 1 day, call your surgeon @ ([phone]) Protein  Shake . Drink at least 2 ounces of shake 5-6 times per day . Each serving of protein shakes (usually 8 - 12 ounces) should have: o 15 grams of protein  o And no more than 5 grams of carbohydrate  . Goal for protein each day: o Men = 80 grams per day o Women = 60 grams per day . Protein powder may be added to fluids such as non-fat milk or Lactaid milk or unsweetened Soy/Almond milk (limit to 35 grams added protein powder per serving)  Hydration . Slowly increase the   amount of water and other clear liquids as tolerated (See Acceptable Fluids) . Slowly increase the amount of protein shake as tolerated  .  Sip fluids slowly and throughout the day.  Do not use straws. . May use sugar substitutes in small amounts (no more than 6 - 8 packets per day; i.e. Splenda)  Fluid Goal . The first goal is to drink at least 8 ounces of protein shake/drink per day (or as directed by the nutritionist); some examples of protein shakes are Syntrax Nectar, Adkins Advantage, EAS Edge HP, and Unjury. See handout from pre-op Bariatric Education Class: o Slowly increase the amount of protein shake you drink as tolerated o You may find it easier to slowly sip shakes throughout the day o It is important to get your proteins in first . Your fluid goal is to drink 64 - 100 ounces of fluid daily o It may take a few weeks to build up to this . 32 oz (or more) should be clear liquids  And  . 32 oz (or more) should be full liquids (see below for examples) . Liquids should not contain sugar, caffeine, or carbonation  Clear Liquids: . Water or Sugar-free flavored water (i.e. Fruit H2O, Propel) . Decaffeinated coffee or tea (sugar-free) . Crystal Lite, Wyler's Lite, Minute Maid Lite . Sugar-free Jell-O . Bouillon or broth . Sugar-free Popsicle:   *Less than 20 calories each; Limit 1 per day  Full Liquids: Protein Shakes/Drinks + 2 choices per day of other full liquids . Full liquids must be: o No More Than 15  grams of Carbs per serving  o No More Than 3 grams of Fat per serving . Strained low-fat cream soup (except Cream of Potato or Tomato) . Non-Fat milk . Fat-free Lactaid Milk . Unsweetened Soy Or Unsweetened Almond Milk . Low Sugar yogurt (Dannon Lite & Fit, Greek yogurt; Oikos Triple Zero; Chobani Simply 100; Yoplait 100 calorie Greek - No Fruit on the Bottom)    Vitamins and Minerals . Start 1 day after surgery unless otherwise directed by your surgeon . 2 Chewable Bariatric Specific Multivitamin / Multimineral Supplement with iron (Example: Bariatric Advantage Multi EA) . Chewable Calcium with Vitamin D-3 (Example: 3 Chewable Calcium Plus 600 with Vitamin D-3) o Take 500 mg three (3) times a day for a total of 1500 mg each day o Do not take all 3 doses of calcium at one time as it may cause constipation, and you can only absorb 500 mg  at a time  o Do not mix multivitamins containing iron with calcium supplements; take 2 hours apart . Menstruating women and those with a history of anemia (a blood disease that causes weakness) may need extra iron o Talk with your doctor to see if you need more iron . Do not stop taking or change any vitamins or minerals until you talk to your dietitian or surgeon . Your Dietitian and/or surgeon must approve all vitamin and mineral supplements   Activity and Exercise: Limit your physical activity as instructed by your doctor.  It is important to continue walking at home.  During this time, use these guidelines: . Do not lift anything greater than ten (10) pounds for at least two (2) weeks . Do not go back to work or drive until your surgeon says you can . You may have sex when you feel comfortable  o It is VERY important for female patients to use a reliable birth control method; fertility often increases after   surgery  o All hormonal birth control will be ineffective for 30 days after surgery due to medications given during surgery a barrier method must be  used. o Do not get pregnant for at least 18 months . Start exercising as soon as your doctor tells you that you can o Make sure your doctor approves any physical activity . Start with a simple walking program . Walk 5-15 minutes each day, 7 days per week.  . Slowly increase until you are walking 30-45 minutes per day Consider joining our BELT program. [phone] or email [email or web address]   Special Instructions Things to remember: . Use your CPAP when sleeping if this applies to you  . [facility] has two free Bariatric Surgery Support Groups that meet monthly o The 3rd Thursday of each month, 6 pm, [facility]  o The 2nd Friday of each month, 11:45 am in the private dining room in the basement of [facility] . It is very important to keep all follow up appointments with your surgeon, dietitian, primary care physician, and behavioral health practitioner . Routine follow up schedule with your surgeon include appointments at 2-3 weeks, 6-8 weeks, 6 months, and 1 year at a minimum.  Your surgeon may request to see you more often.   o After the first year, please follow up with your bariatric surgeon and dietitian at least once a year in order to maintain best weight loss results [facility]: [phone] [facility]: [phone] Bariatric Nurse Coordinator: [phone]      Reviewed and Endorsed  by [facility] Patient Education Committee, June, 2016 Edits Approved: Aug, 2018    

## 2019-01-29 LAB — CBC WITH DIFFERENTIAL/PLATELET
Abs Immature Granulocytes: 0.03 10*3/uL (ref 0.00–0.07)
Basophils Absolute: 0 10*3/uL (ref 0.0–0.1)
Basophils Relative: 0 %
Eosinophils Absolute: 0 10*3/uL (ref 0.0–0.5)
Eosinophils Relative: 0 %
HCT: 37.7 % (ref 36.0–46.0)
Hemoglobin: 12.3 g/dL (ref 12.0–15.0)
Immature Granulocytes: 0 %
Lymphocytes Relative: 31 %
Lymphs Abs: 2.3 10*3/uL (ref 0.7–4.0)
MCH: 32.5 pg (ref 26.0–34.0)
MCHC: 32.6 g/dL (ref 30.0–36.0)
MCV: 99.5 fL (ref 80.0–100.0)
Monocytes Absolute: 1 10*3/uL (ref 0.1–1.0)
Monocytes Relative: 13 %
Neutro Abs: 4.2 10*3/uL (ref 1.7–7.7)
Neutrophils Relative %: 56 %
Platelets: 212 10*3/uL (ref 150–400)
RBC: 3.79 MIL/uL — ABNORMAL LOW (ref 3.87–5.11)
RDW: 12.4 % (ref 11.5–15.5)
WBC: 7.5 10*3/uL (ref 4.0–10.5)
nRBC: 0 % (ref 0.0–0.2)

## 2019-01-29 MED ORDER — TRAMADOL HCL 50 MG PO TABS: 50.0000 mg | ORAL_TABLET | Freq: Four times a day (QID) | ORAL | 0 refills | Status: DC | PRN

## 2019-01-29 MED ORDER — DIPHENHYDRAMINE HCL 25 MG PO CAPS
25.0000 mg | ORAL_CAPSULE | Freq: Every evening | ORAL | Status: DC | PRN
  Administered 2019-01-29: 25 mg via ORAL
  Filled 2019-01-29: qty 1

## 2019-01-29 MED ORDER — ONDANSETRON 4 MG PO TBDP: 4.0000 mg | ORAL_TABLET | Freq: Four times a day (QID) | ORAL | 0 refills | Status: AC | PRN

## 2019-01-29 MED ORDER — ENOXAPARIN SODIUM 40 MG/0.4ML ~~LOC~~ SOLN: 40.0000 mg | Freq: Two times a day (BID) | SUBCUTANEOUS | 0 refills | Status: AC

## 2019-01-29 MED ORDER — DOCUSATE SODIUM 100 MG PO CAPS: 100.0000 mg | ORAL_CAPSULE | Freq: Two times a day (BID) | ORAL | 0 refills | Status: AC

## 2019-01-29 MED ORDER — PANTOPRAZOLE SODIUM 40 MG PO TBEC: 40.0000 mg | DELAYED_RELEASE_TABLET | Freq: Every day | ORAL | 0 refills | Status: AC

## 2019-01-29 MED ORDER — OXYCODONE HCL 5 MG/5ML PO SOLN
5.0000 mg | ORAL | Status: DC | PRN
  Administered 2019-01-29 – 2019-01-30 (×2): 10 mg via ORAL
  Filled 2019-01-29 (×2): qty 10

## 2019-01-29 MED ORDER — ACETAMINOPHEN 500 MG PO TABS: 1000.0000 mg | ORAL_TABLET | Freq: Three times a day (TID) | ORAL | 0 refills | Status: AC

## 2019-01-29 MED ORDER — GABAPENTIN 100 MG PO CAPS: 200.0000 mg | ORAL_CAPSULE | Freq: Two times a day (BID) | ORAL | 0 refills | Status: AC

## 2019-01-29 NOTE — Progress Notes (Signed)
Provided lovenox teaching to pt. All questions answered.

## 2019-01-29 NOTE — Progress Notes (Signed)
Patient alert and oriented, Post op day 2.  Provided support and encouragement.  Encouraged pulmonary toilet, ambulation and small sips of liquids.  All questions answered.  Will continue to monitor. 

## 2019-01-29 NOTE — Progress Notes (Signed)
S: Had a rough afternoon with upper abdominal pain radiating to her back. Improving now, likely over-filled the gastric pouch. No nausea or emesis. Has a heating pad on her abdomen.  Vitals, labs, intake/output, and orders reviewed at this time. Tmax 100.6, Tc 99.4 HR 65-86. Normo- to hypertensive. 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 Possible discharge this afternoon if continues to improve.   Romana Juniper, MD [facility], [place] Pager [phone]

## 2019-01-29 NOTE — Progress Notes (Signed)
Discussed with patient's nurse- Patient reportedly still having pain- has rec'd dilaudid x 1 today and about to try robaxin. Vitals remain normal. Only 150 PO recorded so far today. Will observe one more night to ensure adequate pain control and PO intake prior to discharge.

## 2019-01-30 MED ORDER — OXYCODONE HCL 5 MG/5ML PO SOLN: 5.0000 mg | ORAL | 0 refills | Status: AC | PRN

## 2019-01-30 NOTE — Progress Notes (Signed)
Patient alert and oriented, pain is controlled. Patient is tolerating fluids, advanced to protein shake today, patient is tolerating well. Reviewed Gastric Bypass discharge instructions with patient and patient is able to articulate understanding. Provided information on BELT program, Support Group and [facility] outpatient pharmacy. All questions answered, will continue to monitor.   Total fluid intake 630 Per dehydration protocol call back one week post op

## 2019-01-30 NOTE — Discharge Summary (Signed)
Physician Discharge Summary  Jenny Ortiz GEZ:[id number] DOB: 05/23/1966 DOA: 01/27/2019  PCP: Garald Balding, FNP  Admit date: 01/27/2019 Discharge date: 01/30/2019  Recommendations for Outpatient Follow-up:    Follow-up Information    Clovis Riley, MD. Go on 02/20/2019.   Specialty: General Surgery Why: at 950  Contact information: [address] [place] [address] [phone]        Clovis Riley, MD .   Specialty: General Surgery Contact information: [address] [place] [address] [phone]          Discharge Diagnoses:  Active Problems:   Morbid obesity [facility])   Surgical Procedure: Laparoscopic Roux-en-Y gastric bypass, upper endoscopy  Discharge Condition: Good Disposition: Home  Diet recommendation: Postoperative gastric bypass diet  Filed Weights   01/27/19 0603  Weight: 103.6 kg     Hospital Course:  The patient was admitted for a planned laparoscopic Roux-en-Y gastric bypass. Please see operative note. Preoperatively the patient was given lovenox for DVT prophylaxis. ERAS protocol was used. Postoperative prophylactic Lovenox dosing was started on the evening of postoperative day 0.  The patient was started on ice chips and water on the evening of POD 0 which they tolerated. On postoperative day 1 The patient's diet was advanced to protein shakes which they also tolerated. On POD 2, The patient was ambulating without difficulty. Their vital signs are stable without fever or tachycardia. Their hemoglobin had remained stable. She issues with pain control which improved by post-op day 3. The patient had received discharge instructions and counseling. They were deemed stable for discharge.  BP 129/81 (BP Location: Left Arm)   Pulse 67   Temp 98.7 F (37.1 C) (Oral)   Resp 17   Ht 5\' 1"  (1.549 m)   Wt 103.6 kg   SpO2 96%   BMI 43.15 kg/m   Gen: alert, NAD, non-toxic appearing Pupils:  equal, no scleral icterus Pulm: Lungs clear to auscultation, symmetric chest rise CV: regular rate and rhythm Abd: soft, min tender, nondistended. No cellulitis. No incisional hernia Ext: no edema, no calf tenderness Skin: no rash, no jaundice  Discharge Instructions  Discharge Instructions    Ambulate hourly while awake   Complete by: As directed    Call MD for:  difficulty breathing, headache or visual disturbances   Complete by: As directed    Call MD for:  difficulty breathing, headache or visual disturbances   Complete by: As directed    Call MD for:  persistant dizziness or light-headedness   Complete by: As directed    Call MD for:  persistant dizziness or light-headedness   Complete by: As directed    Call MD for:  persistant nausea and vomiting   Complete by: As directed    Call MD for:  persistant nausea and vomiting   Complete by: As directed    Call MD for:  redness, tenderness, or signs of infection (pain, swelling, redness, odor or green/yellow discharge around incision site)   Complete by: As directed    Call MD for:  redness, tenderness, or signs of infection (pain, swelling, redness, odor or green/yellow discharge around incision site)   Complete by: As directed    Call MD for:  severe uncontrolled pain   Complete by: As directed    Call MD for:  severe uncontrolled pain   Complete by: As directed    Call MD for:  temperature >101 F   Complete by: As directed  Call MD for:  temperature >101 F   Complete by: As directed    Diet bariatric full liquid   Complete by: As directed    Incentive spirometry   Complete by: As directed    Perform hourly while awake     Allergies as of 01/30/2019      Reactions   Amoxicillin Diarrhea   Did it involve swelling of the face/tongue/throat, SOB, or low BP? No Did it involve sudden or severe rash/hives, skin peeling, or any reaction on the inside of your mouth or nose? No Did you need to seek medical attention at a  hospital or doctor's office? No When did it last happen?4 years ago If all above answers are "NO", may proceed with cephalosporin use.      Medication List    STOP taking these medications   omeprazole 20 MG capsule Commonly known as: PRILOSEC     TAKE these medications   acetaminophen 500 MG tablet Commonly known as: TYLENOL Take 2 tablets (1,000 mg total) by mouth every 8 (eight) hours for 5 days.   amLODipine 10 MG tablet Commonly known as: NORVASC Take 10 mg by mouth at bedtime. Notes to patient: Monitor Blood Pressure Daily and keep a log for primary care physician.  You may need to make changes to your medications with rapid weight loss.     buPROPion 150 MG 24 hr tablet Commonly known as: WELLBUTRIN XL Take 150 mg by mouth at bedtime.   docusate sodium 100 MG capsule Commonly known as: COLACE Take 1 capsule (100 mg total) by mouth 2 (two) times daily.   enoxaparin 40 MG/0.4ML injection Commonly known as: LOVENOX Inject 0.4 mLs (40 mg total) into the skin every 12 (twelve) hours for 26 days.   escitalopram 20 MG tablet Commonly known as: LEXAPRO Take 20 mg by mouth at bedtime.   gabapentin 100 MG capsule Commonly known as: NEURONTIN Take 2 capsules (200 mg total) by mouth every 12 (twelve) hours.   ondansetron 4 MG disintegrating tablet Commonly known as: ZOFRAN-ODT Take 1 tablet (4 mg total) by mouth every 6 (six) hours as needed for nausea or vomiting.   oxyCODONE 5 MG/5ML solution Commonly known as: ROXICODONE Take 5-10 mLs (5-10 mg total) by mouth every 4 (four) hours as needed for severe pain.   pantoprazole 40 MG tablet Commonly known as: PROTONIX Take 1 tablet (40 mg total) by mouth daily.   simvastatin 40 MG tablet Commonly known as: ZOCOR Take 40 mg by mouth at bedtime.      Follow-up Information    Berna Bueonnor, Fahd Galea A, MD. Go on 02/20/2019.   Specialty: General Surgery Why: at 950  Contact information: [address] [place] [address] [phone]        Berna Bueonnor, Brody Bonneau A, MD .   Specialty: General Surgery Contact information: [address] [place] [address] [phone]            The results of significant diagnostics from this hospitalization (including imaging, microbiology, ancillary and laboratory) are listed below for reference.    Significant Diagnostic Studies: No results found.  Labs: Basic Metabolic Panel: Recent Labs  Lab 01/23/19 1457 01/28/19 0339  NA 139 140  K 3.9 3.7  CL 106 107  CO2 24 26  GLUCOSE 95 108*  BUN 23* 12  CREATININE 0.83 0.68  CALCIUM 9.2 8.7*  MG  --  2.5*   Liver Function Tests: Recent Labs  Lab 01/23/19 1457 01/28/19 0339  AST 51* 65*  ALT 73* 84*  ALKPHOS 93 73  BILITOT 1.1 0.9  PROT 7.6 6.5  ALBUMIN 4.4 3.7    CBC: Recent Labs  Lab 01/23/19 1457 01/28/19 0339 01/29/19 0317  WBC 7.1 9.0 7.5  NEUTROABS 3.7 5.9 4.2  HGB 13.6 12.0 12.3  HCT 39.7 37.0 37.7  MCV 97.8 100.8* 99.5  PLT 246 224 212    CBG: No results for input(s): GLUCAP in the last 168 hours.  Active Problems:   Morbid obesity (HCC)    Signed:  Berna Bue, MD [facility] Surgery, [place] [phone] 01/30/2019, 12:22 PM

## 2019-01-30 NOTE — Progress Notes (Signed)
S: Still with epigastric crampy discomfort. Improving. Rec'd dilaudid x 1 and robaxin x 1 yesterday afternoon and received one dose of oxycodone, but has not taken any PRN meds since 5pm yesterday. Walking, tolerating liquids.   Vitals, labs, intake/output, and orders reviewed at this time. Tmax 100.1, Tc 98.7. HR 67-82. Normotensive. Sats 90s on room air PO 630, UOP 2500  Gen: A&Ox3, no distress  H&N: EOMI, atraumatic, neck supple Chest: unlabored respirations, RRR Abd: soft, minimally tender in epigastrium, nondistended, incisions c/d/i with steris, no cellulitis or hematoma. Faint redness about [id number] about supraumbilical incision likely from heating pad. Ext: warm, no edema Neuro: grossly normal  Lines/tubes/drains: PIV  A/P:  POD 3 roux en y gastric bypass Continue liquids/ protein shakes Continue lovenox/ SCDs  -Will need lovenox on DC Continue pulm toilet and ambulating  Has not had a bowel movement since Sunday- had also been constipated preop from the preop diet- discussed continuing stool softeners at home, can try a dulcolax suppository if desired at home. This is likely adding to her discomfort.   Discharge this morning.    Romana Juniper, MD [facility] Surgery, [place] Pager [phone]

## 2019-01-30 NOTE — Progress Notes (Signed)
Discharge instructions given to patient by Bariatric nurse. Patient had no questions. NT or writer will wheel patient out once husband comes in

## 2019-01-30 NOTE — Progress Notes (Signed)
Patient alert and oriented, Post op day 3.  Provided support and encouragement.  Encouraged pulmonary toilet, ambulation and small sips of liquids. Pain/nausea controlled.  Drinking protein this morning. All questions answered.  Will continue to monitor.

## 2019-02-04 ENCOUNTER — Telehealth (HOSPITAL_COMMUNITY): Payer: Self-pay

## 2019-02-04 NOTE — Telephone Encounter (Signed)
Patient called to discuss post bariatric surgery follow up questions.  See below:   1.  Tell me about your pain and pain management?at this time pain very minimal, using tylenol at this point  2.  Let's talk about fluid intake.  How much total fluid are you taking in?60 ounces  3.  How much protein have you taken in the last 2 days?35 grams of protein  4.  Have you had nausea?  Tell me about when have experienced nausea and what you did to help?denies  5.  Has the frequency or color changed with your urine?making urine  6.  Tell me what your incisions look like?looks good has one incision with an intact blister  7.  Have you been passing gas? BM?had bm Saturday 02/02/2019  8.  If a problem or question were to arise who would you call?  Do you know contact numbers for [place], [facility], and [facility]?aware of how to contact all services  9.  How has the walking going?walking around regularly  10.  How are your vitamins and calcium going?  How are you taking them?MVI and Calcium started

## 2019-02-11 ENCOUNTER — Encounter: Payer: BC Managed Care – PPO | Attending: General Surgery | Admitting: Dietician

## 2019-02-11 ENCOUNTER — Encounter: Payer: Self-pay | Admitting: Dietician

## 2019-02-11 ENCOUNTER — Other Ambulatory Visit: Payer: Self-pay

## 2019-02-11 DIAGNOSIS — E669 Obesity, unspecified: Secondary | ICD-10-CM | POA: Diagnosis not present

## 2019-02-11 NOTE — Progress Notes (Addendum)
Bariatric Class  Start Time: 3:30pm   End Time: 4:05pm  2 Week Post-Operative Nutrition Class  Patient was seen on 02/11/2019 for post-operative nutrition education at [facility]   Surgery date: 01/27/2019 Surgery type: RYGB Start weight at [facility]: 222.6 lbs (date: 02/18/2018) Weight today: 217.8 lbs Weight change: -5 lbs  Body Composition Scale 02/11/2019  Weight (lbs) 217.8  BMI 41.2  Total Body Fat % 45     Visceral Fat 17  Fat-Free Mass % 54.8     Total Body Water % 41.9     Muscle-Mass (lbs) 27.9  Body Fat Displacement ---         Torso  (lbs) 60.8         Left Leg  (lbs) 12.1         Right Leg  (lbs) 12.1         Left Arm  (lbs) 6         Right Arm  (lbs) 6   Fluids include water and decaf unsweet tea Fluid intake: 64+ oz/day   The following the learning objectives were met by the patient during this course:  Identifies Phase 3 (Soft, High Protein Foods) Dietary Goals and will begin from 2 weeks post-operatively to 2 months post-operatively  Identifies appropriate sources of fluids and proteins   States protein recommendations and appropriate sources post-operatively  Identifies the need for appropriate texture modifications, mastication, and bite sizes when consuming solids  Identifies appropriate multivitamin and calcium sources post-operatively  Describes the need for physical activity post-operatively and will follow MD recommendations  States when to call healthcare provider regarding medication questions or post-operative complications  Handouts given during class include:  Phase 3: Soft, High Protein Diet  Phase 3 Meal Ideas  Follow-Up Plan: Patient will follow-up at [facility] in 6 weeks for 2 month post-op nutrition visit for diet advancement per MD.

## 2019-02-17 ENCOUNTER — Telehealth: Payer: Self-pay | Admitting: Dietician

## 2019-02-17 NOTE — Telephone Encounter (Signed)
I spoke with patient via telephone to assess fluid intake and food tolerance since diet advancement to solid protein foods on 02/11/2019.  Surgery Date: 01/27/2019 Surgery Type: RYGB  Daily fluid intake: 64 ounces Daily protein intake: 50 grams  Patient states eggs, soy sausage, cheese, yogurt, deli ham are main protein choices. No issues with tolerating any foods or fluids so far. Overall meeting daily fluid goal and consuming at least 50 grams of protein daily. No questions or concerns per patient.     Nat Christen Ronceverte) Embrie Mikkelsen, MS, RD, LDN

## 2019-03-27 ENCOUNTER — Encounter: Payer: BC Managed Care – PPO | Attending: General Surgery | Admitting: Dietician

## 2019-03-27 ENCOUNTER — Other Ambulatory Visit: Payer: Self-pay

## 2019-03-27 ENCOUNTER — Encounter: Payer: Self-pay | Admitting: Dietician

## 2019-03-27 DIAGNOSIS — E669 Obesity, unspecified: Secondary | ICD-10-CM | POA: Diagnosis not present

## 2019-03-27 NOTE — Patient Instructions (Signed)

## 2019-03-27 NOTE — Progress Notes (Signed)
Bariatric Nutrition Follow-Up Visit Medical Nutrition Therapy  Appt Start Time: 11:45am   End Time: 12:15pm  2 Months Post-Operative RYGB Surgery Surgery Date: 01/27/2019  Pt's Expectations of Surgery/ Goals: to come off medications, improve health, feel better, improve hip pain, increase ability to walk for longer distances, improve quality of life   NUTRITION ASSESSMENT  Anthropometrics  Start weight at [facility]: 222.6 lbs (date: 02/18/2018) Today's weight: N/A (pt declined)    Body Composition Scale 02/11/2019  Weight  lbs 217.8  BMI 41.2  Total Body Fat  % 45     Visceral Fat 17  Fat-Free Mass  % 54.8     Total Body Water  % 41.9     Muscle-Mass  lbs 27.9  Body Fat Displacement ---         Torso  lbs 60.8         Left Leg  lbs 12.1         Right Leg  lbs 12.1         Left Arm  lbs 6         Right Arm  lbs 6     Lifestyle & Dietary Hx States her sister had this surgery about 8 years ago.  States she is not tolerating foods very well, particularly chicken. Can eat beans, yogurt, eggs, and hamburger pretty well. Has a shake every morning for breakfast. States she thinks she eats too quickly and this contributes to why food makes her stomach hurt. Must eat lunch in a hurry because only has about 10 minutes to eat. Drinks lots of water.   Estimated daily fluid intake: 50-70 oz Estimated daily protein intake: 50-60 g Supplements: bariatric MVI, calcium 3x  Current average weekly physical activity: ADLs    Post-Op Goals/ Signs/ Symptoms Using straws: no Drinking while eating: no Chewing/swallowing difficulties: no Changes in vision: no Changes to mood/headaches: no Hair loss/changes to skin/nails: no Difficulty focusing/concentrating: no Sweating: no Dizziness/lightheadedness: no Palpitations: no  Carbonated/caffeinated beverages: no N/V/D/C/Gas: yes (constipation)  Abdominal pain: no Dumping syndrome: no   NUTRITION DIAGNOSIS  Overweight/obesity ([place]-3.3) related to  past poor dietary habits and physical inactivity as evidenced by completed bariatric surgery and following dietary guidelines for continued weight loss and healthy nutrition status.   NUTRITION INTERVENTION Nutrition counseling (C-1) and education (E-2) to facilitate bariatric surgery goals, including: . Diet advancement to the next phase (phase 4) now including non-starchy vegetables  . The importance of consuming adequate calories as well as certain nutrients daily due to the body's need for essential vitamins, minerals, and fats . The importance of daily physical activity and to reach a goal of at least 150 minutes of moderate to vigorous physical activity weekly (or as directed by their physician) due to benefits such as increased musculature and improved lab values  Handouts Provided Include   Phase 4: Protein + Non-Starchy Vegetables  Bariatric Vitamins & Minerals   Learning Style & Readiness for Change Teaching method utilized: Visual & Auditory  Demonstrated degree of understanding via: Teach Back  Barriers to learning/adherence to lifestyle change: None Identified    MONITORING & EVALUATION Dietary intake, weekly physical activity, body weight, and goals in 4 months.  Next Steps Patient is to follow-up in 4 months for 6 month post-op follow-up.

## 2019-08-05 ENCOUNTER — Encounter: Payer: BC Managed Care – PPO | Attending: Surgery | Admitting: Skilled Nursing Facility1

## 2019-08-05 ENCOUNTER — Other Ambulatory Visit: Payer: Self-pay

## 2019-08-06 NOTE — Progress Notes (Signed)
Follow-up visit:  Post-Operative RYGB Surgery  Medical Nutrition Therapy:  Appt start time: 6:00pm end time:  7:00pm  Primary concerns today: Post-operative Bariatric Surgery Nutrition Management 6 Month Post-Op Class  Start weight at [facility]: 222.6 lbs (date: 02/18/2018)    Information Reviewed/ Discussed During Appointment: -Review of composition scale numbers -Fluid requirements (64-100 ounces) -Protein requirements (60-80g) -Strategies for tolerating diet -Advancement of diet to include Starchy vegetables -Barriers to inclusion of new foods -Inclusion of appropriate multivitamin and calcium supplements  -Exercise recommendations   Fluid intake: adequate   Medications: See List Supplementation: appropriate   Using straws: no Drinking while eating: no Having you been chewing well: yes Chewing/swallowing difficulties: no Changes in vision: no Changes to mood/headaches: no Hair loss/C[place] to skin/Changes to nails: no Any difficulty focusing or concentrating: no Sweating: no Dizziness/Lightheaded: no Palpitations: no  Carbonated beverages: no N/V/D/C/GAS: no Abdominal Pain: no Dumping syndrome: no  Recent physical activity:  ADL's  Progress Towards Goal(s):  In Progress  Handouts given during visit include:  Phase V diet Progression   Goals Sheet  The Benefits of Exercise are endless.....  Support Group Topics  Pt Chosen Goals: Nutrition Goals:  I will drink 32 ounces of plain water 7 days a week by (specific date) _06/07/21_______________  I will (type of movement) _____walk____________________ for (hours/minutes) _____1_____, for (how many days a week) ___________3_______________ by (specific date) ________06/07/21________  Teaching Method Utilized:  Visual Auditory Hands on  Demonstrated degree of understanding via:  Teach Back   Monitoring/Evaluation:  Dietary intake, exercise, and body weight. Follow up in 3 months for 9 month post-op visit.

## 2019-11-03 ENCOUNTER — Ambulatory Visit: Payer: BC Managed Care – PPO | Admitting: Skilled Nursing Facility1

## 2019-11-05 ENCOUNTER — Encounter: Payer: BC Managed Care – PPO | Attending: Surgery | Admitting: Skilled Nursing Facility1

## 2019-12-02 IMAGING — RF DG UGI W/ KUB
12 of 14 series · 12 of 24 positions shown · non-contrast
Comparison: None.

CLINICAL DATA: Preoperative for bariatric surgery, morbid obesity.

EXAM:
UPPER GI SERIES WITH KUB
TECHNIQUE: After obtaining a scout radiograph a routine upper GI series was
performed using thin barium
FLUOROSCOPY TIME:  Fluoroscopy Time:  2 minutes, 24 seconds
Radiation Exposure Index (if provided by the fluoroscopic device):
5.7 mGy
Number of Acquired Spot Images: 4

[Series 2: cp_standard · 0.34mm/px · 1 of 32 frames shown (1 of 10)]
[frame 9/32]
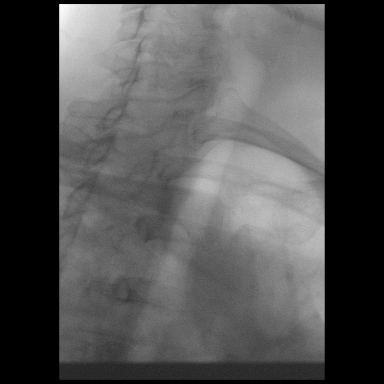

[Series 3: cp_standard · 0.34mm/px · 1 of 26 frames shown (2 of 10)]
[frame 7/26]
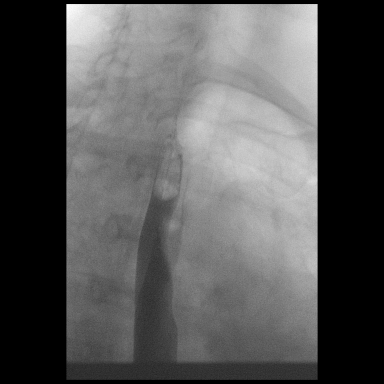

[Series 4: cp_standard · 0.34mm/px · 1 of 29 frames shown (3 of 10)]
[frame 5/29]
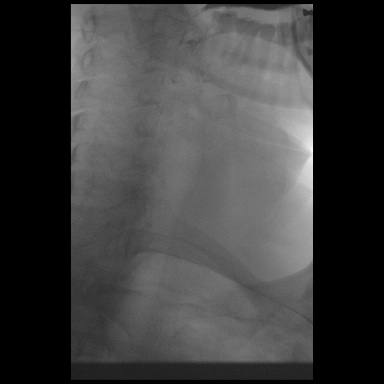

[Series 5: cp_standard · 0.34mm/px · 1 of 60 frames shown (4 of 10)]
[frame 10/60]
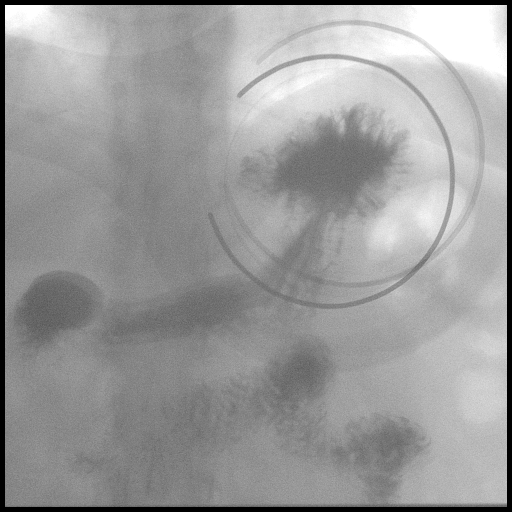

[Series 6: fluoro_barium 2fps_bw · 0.17mm/px · 1 of 1 slices shown (1 of 2)]
[im 1/1]
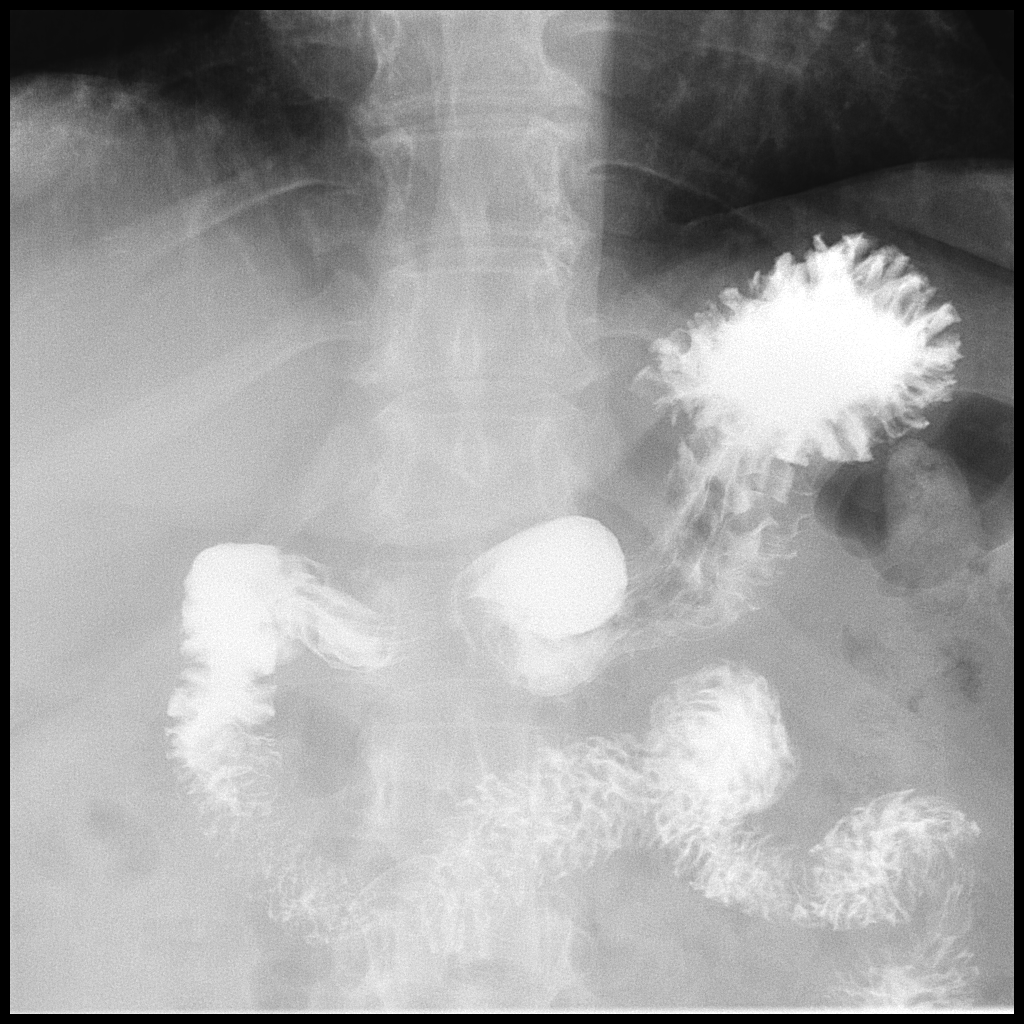

[Series 9: cp_standard · 0.34mm/px · 1 of 42 frames shown (5 of 10)]
[frame 7/42]
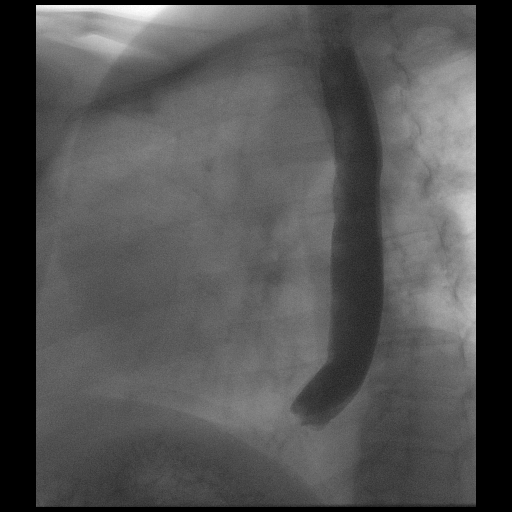

[Series 10: cp_standard · 0.34mm/px · 1 of 45 frames shown (6 of 10)]
[frame 12/45]
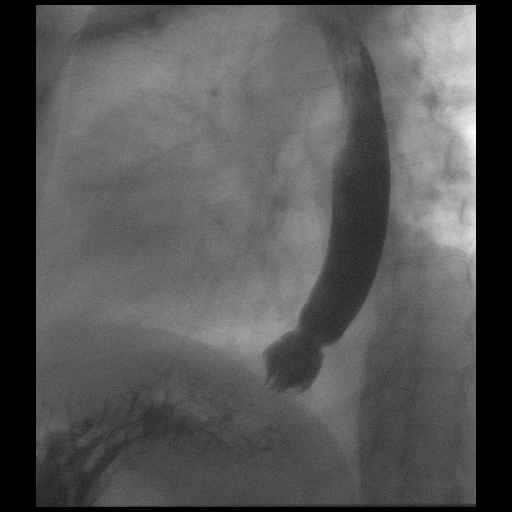

[Series 11: cp_standard · 0.34mm/px · 1 of 35 frames shown (7 of 10)]
[frame 10/35]
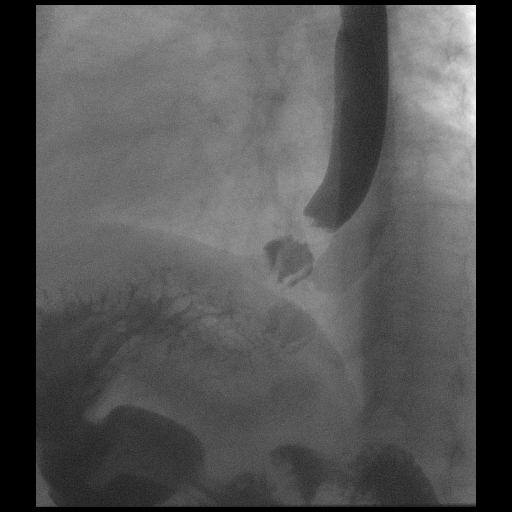

[Series 12: cp_standard · 0.34mm/px · 1 of 50 frames shown (8 of 10)]
[frame 16/50]
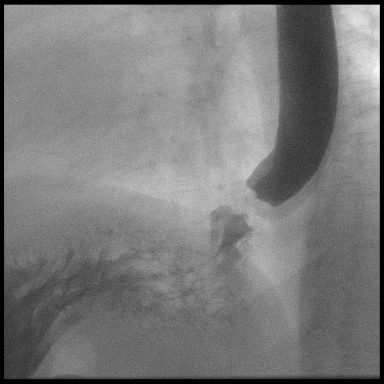

[Series 13: fluoro_barium 2fps_bw · 0.17mm/px · 1 of 1 slices shown (2 of 2)]
[im 1/1]
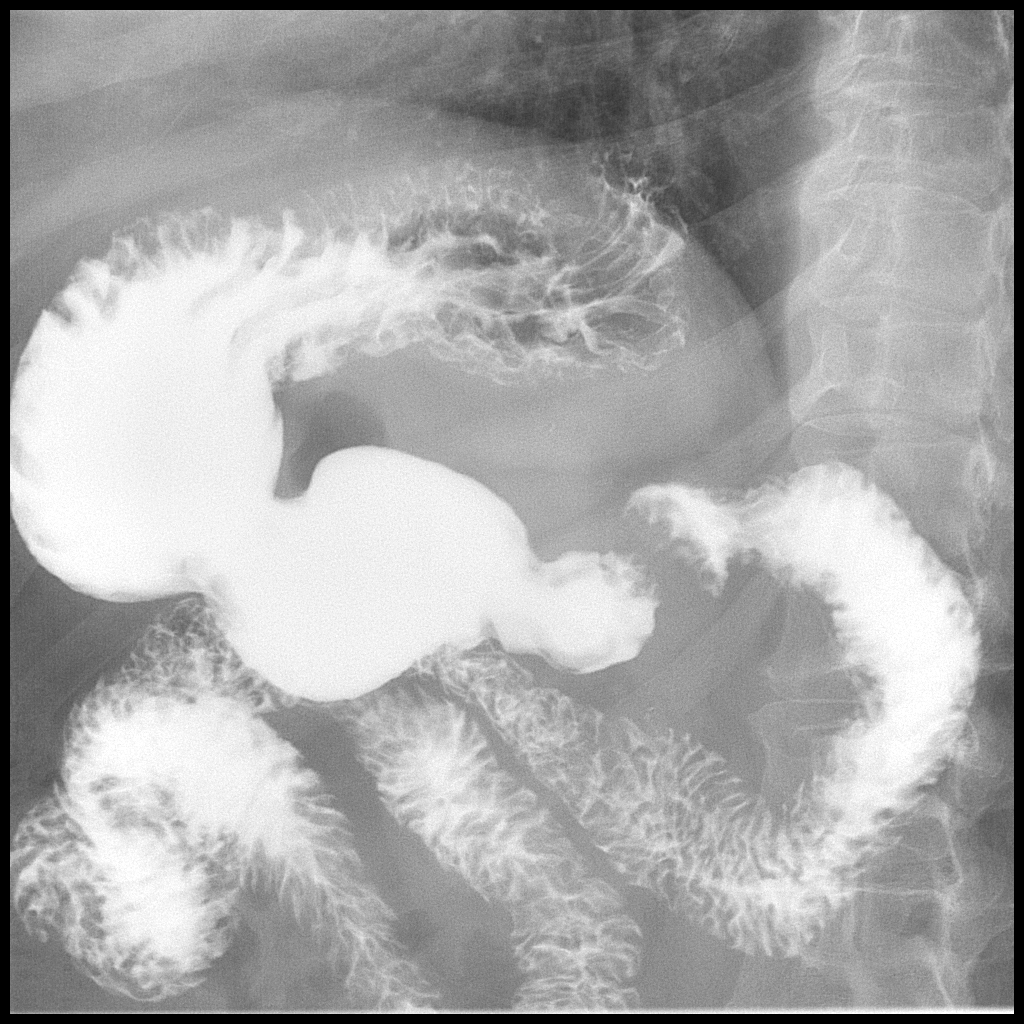

[Series 14: cp_standard · 0.34mm/px · 1 of 34 frames shown (9 of 10)]
[frame 29/34]
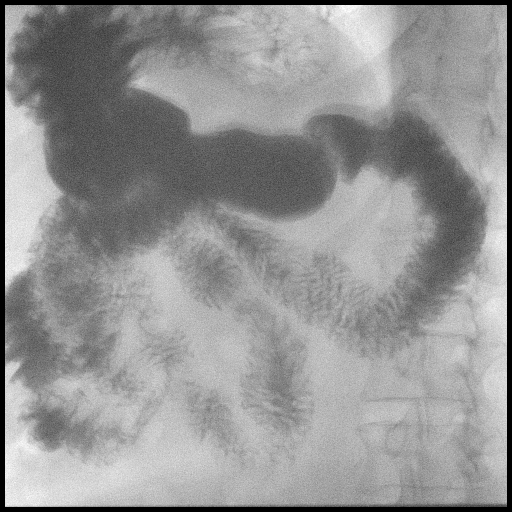

[Series 15: cp_standard · 0.34mm/px · 1 of 11 frames shown (10 of 10)]
[frame 11/11]
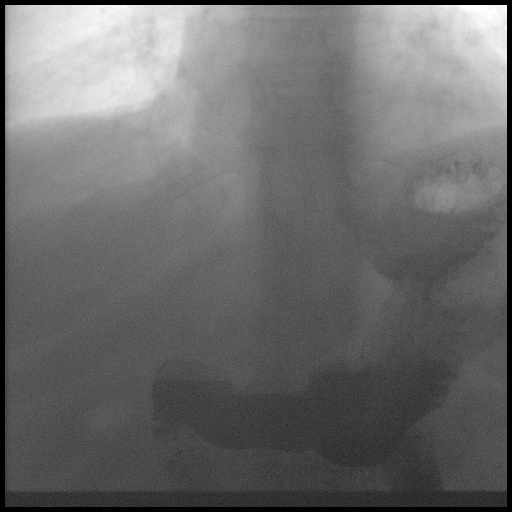

[12 of 24 positions shown; findings below may reference images not displayed]

FINDINGS: Initial KUB appears unremarkable.

Primary peristaltic waves in the esophagus were normal on all
swallows. There was an air and small type 1 hiatal hernia. No
radiographic findings of esophagitis.

Compression fluoroscopy of the stomach revealed normal configuration
and appearance without abnormal filling defect. Normal configuration
and appearance of the duodenum. The duodenal bulb is likewise
normal.

A 13 mm barium tablet passed into the stomach without difficulty.
IMPRESSION: 1. Small intermittent type 1 hiatal hernia. Otherwise normal upper
GI examination.

## 2022-08-31 ENCOUNTER — Encounter (HOSPITAL_COMMUNITY): Payer: Self-pay | Admitting: *Deleted

## 2023-08-31 ENCOUNTER — Encounter (HOSPITAL_COMMUNITY): Payer: Self-pay | Admitting: *Deleted
